# Patient Record
Sex: Male | Born: 1950
Health system: Southern US, Community
[De-identification: ages and names within clinical notes are randomized; demographics above are authoritative.]

## PROBLEM LIST (undated history)

## (undated) DIAGNOSIS — K219 Gastro-esophageal reflux disease without esophagitis: Secondary | ICD-10-CM

## (undated) DIAGNOSIS — K921 Melena: Secondary | ICD-10-CM

## (undated) HISTORY — DX: Melena: K92.1

## (undated) HISTORY — DX: Gastro-esophageal reflux disease without esophagitis: K21.9

---

## 2008-05-25 ENCOUNTER — Ambulatory Visit (HOSPITAL_COMMUNITY): Admission: RE | Admit: 2008-05-25 | Discharge: 2008-05-25 | Payer: Self-pay | Admitting: Gastroenterology

## 2008-05-25 ENCOUNTER — Encounter (INDEPENDENT_AMBULATORY_CARE_PROVIDER_SITE_OTHER): Payer: Self-pay | Admitting: Gastroenterology

## 2009-02-11 ENCOUNTER — Ambulatory Visit: Payer: Self-pay | Admitting: Internal Medicine

## 2009-02-11 ENCOUNTER — Observation Stay (HOSPITAL_COMMUNITY): Admission: EM | Admit: 2009-02-11 | Discharge: 2009-02-12 | Payer: Self-pay | Admitting: Emergency Medicine

## 2009-02-12 ENCOUNTER — Encounter (INDEPENDENT_AMBULATORY_CARE_PROVIDER_SITE_OTHER): Payer: Self-pay | Admitting: Gastroenterology

## 2010-10-20 LAB — HEMOGLOBIN AND HEMATOCRIT, BLOOD
HCT: 29.5 % — ABNORMAL LOW (ref 39.0–52.0)
Hemoglobin: 10.1 g/dL — ABNORMAL LOW (ref 13.0–17.0)

## 2010-10-21 LAB — CROSSMATCH

## 2010-10-21 LAB — COMPREHENSIVE METABOLIC PANEL
AST: 21 U/L (ref 0–37)
Albumin: 3.5 g/dL (ref 3.5–5.2)
Chloride: 106 mEq/L (ref 96–112)
Creatinine, Ser: 0.98 mg/dL (ref 0.4–1.5)
GFR calc Af Amer: >60 mL/min (ref >60)
Potassium: 3.9 mEq/L (ref 3.5–5.1)
Total Bilirubin: 0.6 mg/dL (ref 0.3–1.2)

## 2010-10-21 LAB — HEMOGLOBIN AND HEMATOCRIT, BLOOD
HCT: 30.6 % — ABNORMAL LOW (ref 39.0–52.0)
Hemoglobin: 10.4 g/dL — ABNORMAL LOW (ref 13.0–17.0)

## 2010-10-21 LAB — DIFFERENTIAL
Basophils Absolute: 0 10*3/uL (ref 0.0–0.1)
Eosinophils Relative: 1 % (ref 0–5)
Lymphocytes Relative: 32 % (ref 12–46)
Monocytes Absolute: 0.3 10*3/uL (ref 0.1–1.0)

## 2010-10-21 LAB — CBC
MCV: 87.6 fL (ref 78.0–100.0)
Platelets: 142 10*3/uL — ABNORMAL LOW (ref 150–400)
WBC: 4.7 10*3/uL (ref 4.0–10.5)

## 2010-10-21 LAB — HEMOCCULT GUIAC POC 1CARD (OFFICE): Fecal Occult Bld: POSITIVE

## 2010-10-21 LAB — PROTIME-INR: INR: 1 (ref 0.00–1.49)

## 2010-10-21 LAB — APTT: aPTT: 27 seconds (ref 24–37)

## 2010-10-21 LAB — ABO/RH: ABO/RH(D): O POS

## 2010-11-27 NOTE — Op Note (Signed)
Jerome Scott, Jerome Scott               ACCOUNT NO.:  [id number]   MEDICAL RECORD NO.:  [id number]          PATIENT TYPE:  AMB   LOCATION:  ENDO                         FACILITY:  [facility]   PHYSICIAN:  John C. Madilyn Fireman, M.D.    DATE OF BIRTH:  [birth year]   DATE OF PROCEDURE:  05/25/2008  DATE OF DISCHARGE:                               OPERATIVE REPORT   INDICATIONS FOR PROCEDURE:  Average risk colon cancer screening in 60-  year-old patient.   PROCEDURE:  The patient was placed in the left lateral decubitus  position and placed on the pulse monitor with continuous low-flow oxygen  delivered by nasal cannula.  He was sedated with 1 mg IV Versed in  addition to the medicine received for the previous EGD.  The Olympus  video colonoscope was inserted into the rectum and advanced to the  cecum, confirmed by transillumination of McBurney point and  visualization of the ileocecal valve and appendiceal orifice.  The prep  was good.  The cecum appeared normal with no masses, polyps,  diverticula, or other mucosal abnormalities.  Within the ascending  colon, there was seen an 8-mm sessile polyp, which was removed by snare.  The remainder of the ascending, transverse, and descending colon all  appeared normal with no masses, polyps, diverticula, or other mucosal  abnormalities.  Within the sigmoid colon, there was seen a few scattered  diverticula.  The rectum appeared normal and the retroflexed view of the  anus revealed no obvious internal hemorrhoids.  The scope was then  withdrawn and the patient returned to the recovery room in stable  condition.  He tolerated the procedure well.  There were no immediate  complications.   IMPRESSION:  1. Ascending colon polyp.  2. Sigmoid diverticulosis.   PLAN:  We will await biopsy results.           ______________________________  Everardo All Madilyn Fireman, M.D.     JCH/MEDQ  D:  05/25/2008  T:  05/25/2008  Job:  [id number]   cc:   Sigmund Hazel, M.D.

## 2010-11-27 NOTE — Consult Note (Signed)
NAMEELISA, Scott               ACCOUNT NO.:  [id number]   MEDICAL RECORD NO.:  [id number]          PATIENT TYPE:  INP   LOCATION:  0103                         FACILITY:  [facility]   PHYSICIAN:  James L. Malon Kindle., M.D.DATE OF BIRTH:  1951/05/17   DATE OF CONSULTATION:  02/11/2009  DATE OF DISCHARGE:                                 CONSULTATION   GASTROENTEROLOGY CONSULTATION   REASON FOR CONSULTATION:  Painless melena.   HISTORY:  The patient is a 60 year old male, who has seen by my partner,  Dr. Dorena Cookey.  In November of 2009, he had upper and lower endoscopies  by Dr. Madilyn Fireman due to some dysphagia, had some esophagitis, has been on  Protonix, which she takes intermittently.  For 3 days, he has had  painless melena.  He notes that has not been on the Protonix now for the  past 2 to 3 weeks.  He denies taking any nonsteroidal drugs, and I named  a number of over-the-counter medications and he denies them all.  He  called and gave the history that he was passing melenic stools and was  told come to the emergency room.  In the emergency room, his hemoglobin  was low at 11, and a rectal exam revealed a dark, tarry, melenic stool  that was grossly positive for blood.   MEDICATIONS ON ADMISSION:  None really.  He is supposed to take Protonix  but has not taken it in 2 or 3 weeks.   He has no drug allergies.   PAST MEDICAL HISTORY:  He has had esophagitis; no other chronic medical  problems.   PAST SURGICAL HISTORY:  No abdominal surgeries.  He has had previous  endoscopy and colonoscopy as noted.   SOCIAL HISTORY:  He does not use alcohol or tobacco. works for [employer].   PHYSICAL EXAMINATION:  VITAL SIGNS:  Normal.  He is afebrile.  EYES:  Sclerae are nonicteric.  NECK:  Supple.  LUNGS:  Clear.  HEART:  Regular rate and rhythm without murmurs or gallops.  ABDOMEN:  Mild obesity, but no guarding, rigidity, or rebound.   ASSESSMENT:  Painless melena - this could be due to a  number of things.  I suspect it is upper gastrointestinal, although he is not really having  any symptoms.  This is suggested by the fact his BUN is 24 and  creatinine 0.8.  I think he definitely needs an upper endoscopy and he  is agreeable.   PLAN:  Patient will be scheduled for an upper endoscopy in the morning  at 8 o'clock.  He has had the procedure done and hopefully we will be  able to discharge him if no acute bleeding is seen.  In the interim, I  would go ahead and give him aggressive PPI therapy.           ______________________________  Llana Aliment. Malon Kindle., M.D.     Waldron Session  D:  02/11/2009  T:  02/11/2009  Job:  [id number]   cc:   Everardo All. Madilyn Fireman, M.D.  Fax: [phone]

## 2010-11-27 NOTE — Op Note (Signed)
NAMETOU, HAYNER NO.:  [id number]   MEDICAL RECORD NO.:  [id number]          PATIENT TYPE:  INP   LOCATION:  1532                         FACILITY:  [facility]   PHYSICIAN:  James L. Malon Kindle., M.D.DATE OF BIRTH:  July 05, 1951   DATE OF PROCEDURE:  02/12/2009  DATE OF DISCHARGE:                               OPERATIVE REPORT   PROCEDURE:  Esophagogastroduodenoscopy and biopsy.   MEDICATIONS:  Cetacaine spray, fentanyl 50 mcg, Versed 5 mg IV.   INDICATIONS:  GI bleed.   DESCRIPTION OF PROCEDURE:  The procedure had been explained to the  patient and consent obtained.  In the left lateral decubitus position  the Pentax upper scope was inserted and advanced.  The scope was passed  down in the esophagus.  The patient had a large hiatal hernia extending  from 42-36 cm.  The diaphragm was seen at approximately 36 cm.  The Z-  line was somewhat irregular and there was a large ulceration with a  small visible vessel and possible Barrett's esophagus right at the  junction of the esophagus and hiatal hernia.  We passed down in the  stomach and a complete endoscopy performed.  No ulcerations or  inflammation.  We withdrew back in the stomach.  Pyloric channel and  antrum were normal.  In the retroflexed view the patient was seen to  have two gastric polyps that were biopsied.  The scope was withdrawn and  the initial findings in the esophagus confirmed.  There was no active  bleeding.   ASSESSMENT:  1. Large esophageal ulcer with possible visible vessel.  2. Possible Barrett's esophagus.  3. Gastric polyps biopsied.   PLAN:  We will treat the patient with high-dose of proton pump  inhibitor.  He will need a repeat procedure in 6 weeks.           ______________________________  Llana Aliment Malon Kindle., M.D.     Waldron Session  D:  02/12/2009  T:  02/12/2009  Job:  [id number]

## 2010-11-27 NOTE — Op Note (Signed)
Jerome Scott, Jerome Scott               ACCOUNT NO.:  [id number]   MEDICAL RECORD NO.:  [id number]          PATIENT TYPE:  AMB   LOCATION:  ENDO                         FACILITY:  [facility]   PHYSICIAN:  John C. Madilyn Fireman, M.D.    DATE OF BIRTH:  14-Feb-1951   DATE OF PROCEDURE:  05/25/2008  DATE OF DISCHARGE:                               OPERATIVE REPORT   INDICATIONS FOR PROCEDURE:  Intermittent solid food dysphagia.   PROCEDURE:  The patient was placed in the left lateral decubitus  position and placed on the pulse monitor with continuous low-flow oxygen  delivered by nasal cannula.  He was sedated with 75 mcg of IV fentanyl  and 5 mg of IV Versed.  The Olympus video endoscope was advanced under  direct vision into the oropharynx and esophagus.  The esophagus was  straight of normal caliber with the squamocolumnar line, irregular at 36  cm.  There were 3-4 saw-tooth shaped erosions, one with exudate and  probably a shallow ulcer extending about 2 cm up from the GE junction.  There was intense erythema surrounding the area of exudate associated  with the shallow ulcer, but no visible suspicion of neoplasm, otherwise  there was possibly some light fibrotic narrowing at that level.  There  was a 2-3 cm hiatal hernia beyond the GE junction.  The stomach was  entered and small amount of liquid secretions were suctioned from the  fundus.  Retroflexed view of the cardia confirmed a hiatal hernia and  was otherwise unremarkable.  The fundus, body, antrum, and pylorus all  appeared normal.  The duodenum was entered in both the bulb and second  portion are well inspected and appeared to be within normal limits.  The  scope was then withdrawn and the patient prepared for colonoscopy.  He  tolerated the procedure well.  There were no immediate complications.   IMPRESSION:  Significant erosive esophagitis with esophageal ulcer,  possible minimal associated stricture.  No dilatation done.   PLAN:   Double-dose proton pump inhibitor and follow up in the office and  consider repeat follow up EGD to assess for Barrett's or to perform  dilatation if necessary.           ______________________________  Everardo All Madilyn Fireman, M.D.     JCH/MEDQ  D:  05/25/2008  T:  05/25/2008  Job:  [id number]   cc:   Sigmund Hazel, M.D.

## 2010-11-27 NOTE — H&P (Signed)
Jerome Scott, Jerome Scott NO.:  [id number]   MEDICAL RECORD NO.:  [id number]          PATIENT TYPE:  EMS   LOCATION:  ED                           FACILITY:  [facility]   PHYSICIAN:  Sanda Linger, MD       DATE OF BIRTH:  [birth year]   DATE OF ADMISSION:  02/11/2009  DATE OF DISCHARGE:                              HISTORY & PHYSICAL   CHIEF COMPLAINT:  Melena.   HISTORY OF PRESENT ILLNESS:  This is a 60 year old male who felt well  until 3 days ago when he started noticing nausea and he says his stool  turned black and cakey.  He reports in November of 2009 he had upper and  lower endoscopies done by Dr. Madilyn Fireman.  He says there was some  inflammation in his esophagus but otherwise those tests were normal.  He  was placed on Protonix which he has consistently taken.  He does not  take any over-the-counter medicines such as aspirin, antiinflammatories  or any other OTC meds.  He does not drink alcohol or smoke cigarettes.  There is no family history of intestinal bleeding.  He has not had any  weight loss, loss of appetite, dysuria, hematuria and so there has been  no bright red blood in his bowel movements.  He has not had any  dizziness, shortness of breath, weakness, lethargy.  He is not having  bleeding or bruising elsewhere.   PAST MEDICAL HISTORY:  Positive for esophagitis.   SOCIAL HISTORY:  He works for [employer].  He is with his wife today.  Denies  abuse tobacco, alcohol and illicit drugs.   SURGICAL HISTORY:  Unremarkable.   CURRENT MEDICATIONS:  Protonix 40 mg once a day.   ALLERGIES:  He has no known drug allergies.   REVIEW OF SYSTEMS:  Negative.   PHYSICAL EXAMINATION:  Shows an alert, healthy, pleasant male.  His  temperature is 98.5.  His blood pressure is 133/87.  His pulse is 84 and  regular, respiratory rate is 18 and unlabored, pulse ox 97% and normal.  HEENT:  Shows pink moist mucous membranes with no icterus.  Nasopharynx,  oropharynx, posterior  pharynx show no lesions or exudate.  NECK:  Supple.  LUNGS:  Clear.  Cardiovascular:  Regular rhythm.  ABDOMEN:  Mildly obese but there is no tenderness on palpation.  No  hepatosplenomegaly, no masses, rebound or guarding.  SKIN:  Warm and moist with no petechiae, angioma or ecchymoses.  EXTREMITIES:  Show no cyanosis, clubbing or edema.  NEUROLOGIC:  Nonfocal.  RECTAL:  Digital rectal examination reveals grossly positive for blood,  melenic  stool.   LABORATORY DATA:  White cell count of 4.7, hemoglobin/hematocrit are  slightly low at 11.0 and 33.0.  Platelet count is normal at 142.  His  liver panel is normal.  His sodium is slightly low at 135, potassium  normal at 3.9, chloride and carbon dioxide are normal.  Glucose is  slightly elevated at 110.  BUN is slightly elevated at 24, creatinine is  normal at 0.98.  His calcium is normal at 8.5.  His total protein is low  at 5.6.   ASSESSMENT:  GI bleeding.  This looks like it is an upper GI bleed  although a lower intestinal bleeding such as diverticular bleed cannot  be completely excluded.   PLAN:  1. He will be admitted to the hospital, held n.p.o., given IV fluids      and IV proton pump inhibitor.  2. I have spoken to Dr. Randa Evens who is on call for Dr. Madilyn Fireman and they      will plan on doing an upper endoscopy on him in the next day or      two.  3. We will recheck his hemoglobin and hematocrit later tonight.  If he      is dropping precipitously will transfuse.  4. We will prevent DVT with venous compression devices.      Sanda Linger, MD  Electronically Signed     TJ/MEDQ  D:  02/11/2009  T:  02/11/2009  Job:  [id number]

## 2013-10-13 LAB — HM COLONOSCOPY: HM COLON: NORMAL

## 2014-01-31 ENCOUNTER — Other Ambulatory Visit: Payer: Self-pay | Admitting: Family Medicine

## 2014-01-31 ENCOUNTER — Encounter: Payer: Self-pay | Admitting: General Practice

## 2014-01-31 ENCOUNTER — Encounter: Payer: Self-pay | Admitting: Family Medicine

## 2014-01-31 ENCOUNTER — Ambulatory Visit (INDEPENDENT_AMBULATORY_CARE_PROVIDER_SITE_OTHER): Payer: BC Managed Care – PPO | Admitting: Family Medicine

## 2014-01-31 VITALS — BP 120/84 | HR 63 | Temp 98.0°F | Resp 16 | Ht 72.05 in | Wt 250.5 lb

## 2014-01-31 DIAGNOSIS — Z0000 Encounter for general adult medical examination without abnormal findings: Secondary | ICD-10-CM | POA: Insufficient documentation

## 2014-01-31 DIAGNOSIS — E785 Hyperlipidemia, unspecified: Secondary | ICD-10-CM

## 2014-01-31 LAB — HEPATIC FUNCTION PANEL
ALBUMIN: 3.9 g/dL (ref 3.5–5.2)
ALT: 36 U/L (ref 0–53)
AST: 32 U/L (ref 0–37)
Alkaline Phosphatase: 38 U/L — ABNORMAL LOW (ref 39–117)
BILIRUBIN DIRECT: 0.2 mg/dL (ref 0.0–0.3)
TOTAL PROTEIN: 6.7 g/dL (ref 6.0–8.3)
Total Bilirubin: 1.2 mg/dL (ref 0.2–1.2)

## 2014-01-31 LAB — BASIC METABOLIC PANEL
BUN: 13 mg/dL (ref 6–23)
CALCIUM: 9.3 mg/dL (ref 8.4–10.5)
CO2: 26 meq/L (ref 19–32)
CREATININE: 1.2 mg/dL (ref 0.4–1.5)
Chloride: 104 mEq/L (ref 96–112)
GFR: 67.43 mL/min (ref >60.00)
GLUCOSE: 81 mg/dL (ref 70–99)
Potassium: 4.1 mEq/L (ref 3.5–5.1)
SODIUM: 136 meq/L (ref 135–145)

## 2014-01-31 LAB — CBC WITH DIFFERENTIAL/PLATELET
BASOS ABS: 0 10*3/uL (ref 0.0–0.1)
BASOS PCT: 0.6 % (ref 0.0–3.0)
EOS ABS: 0.1 10*3/uL (ref 0.0–0.7)
Eosinophils Relative: 1.1 % (ref 0.0–5.0)
HCT: 40.6 % (ref 39.0–52.0)
Hemoglobin: 13.4 g/dL (ref 13.0–17.0)
LYMPHS PCT: 29.5 % (ref 12.0–46.0)
Lymphs Abs: 1.4 10*3/uL (ref 0.7–4.0)
MCHC: 32.9 g/dL (ref 30.0–36.0)
MCV: 80.9 fl (ref 78.0–100.0)
MONOS PCT: 6.5 % (ref 3.0–12.0)
Monocytes Absolute: 0.3 10*3/uL (ref 0.1–1.0)
NEUTROS PCT: 62.3 % (ref 43.0–77.0)
Neutro Abs: 3.1 10*3/uL (ref 1.4–7.7)
Platelets: 156 10*3/uL (ref 150.0–400.0)
RBC: 5.02 Mil/uL (ref 4.22–5.81)
RDW: 14.9 % (ref 11.5–15.5)
WBC: 4.9 10*3/uL (ref 4.0–10.5)

## 2014-01-31 LAB — LIPID PANEL
CHOLESTEROL: 202 mg/dL — AB (ref 0–200)
HDL: 34.1 mg/dL — AB (ref >39.00)
LDL CALC: 125 mg/dL — AB (ref 0–99)
NonHDL: 167.9
TRIGLYCERIDES: 213 mg/dL — AB (ref 0.0–149.0)
Total CHOL/HDL Ratio: 6
VLDL: 42.6 mg/dL — AB (ref 0.0–40.0)

## 2014-01-31 LAB — TSH: TSH: 2.8 u[IU]/mL (ref 0.35–4.50)

## 2014-01-31 LAB — PSA: PSA: 2.59 ng/mL (ref 0.10–4.00)

## 2014-01-31 NOTE — Progress Notes (Signed)
Pre visit review using our clinic review tool, if applicable. No additional management support is needed unless otherwise documented below in the visit note. 

## 2014-01-31 NOTE — Patient Instructions (Signed)
Follow up in 1 year or as needed We'll notify you of your lab results and make any changes if needed Try and make healthy food choices and get regular exercise Call with any questions or concerns Enjoy the rest of your summer! Welcome!  We're glad to have you!

## 2014-01-31 NOTE — Progress Notes (Signed)
   Subjective:    Patient ID: Jerome Scott, male    DOB: Dec 30, 1950, 63 y.o.   MRN: [id number]  HPI New to establish.  Previous PCP- [facility] but did not go w/ regularity  UTD on colonoscopy- [facility]  CPE- no concerns today, UTD on colonoscopy.   Review of Systems Patient reports no vision/hearing changes, anorexia, fever ,adenopathy, persistant/recurrent hoarseness, swallowing issues, chest pain, palpitations, edema, persistant/recurrent cough, hemoptysis, dyspnea (rest,exertional, paroxysmal nocturnal), gastrointestinal  bleeding (melena, rectal bleeding), abdominal pain, excessive heart burn, GU symptoms (dysuria, hematuria, voiding/incontinence issues) syncope, focal weakness, memory loss, numbness & tingling, skin/hair/nail changes, depression, anxiety, abnormal bruising/bleeding, musculoskeletal symptoms/signs.     Objective:   Physical Exam BP 120/84  Pulse 63  Temp(Src) 98 F (36.7 C) (Oral)  Resp 16  Ht 5\' 5"  (1.651 m)  Wt 250 lb 8 oz (113.626 kg)  BMI 41.69 kg/m2  SpO2 96%  General Appearance:    Alert, cooperative, no distress, appears stated age  Head:    Normocephalic, without obvious abnormality, atraumatic  Eyes:    PERRL, conjunctiva/corneas clear, EOM's intact, fundi    benign, both eyes       Ears:    Normal TM's and external ear canals, both ears  Nose:   Nares normal, septum midline, mucosa normal, no drainage   or sinus tenderness  Throat:   Lips, mucosa, and tongue normal; teeth and gums normal  Neck:   Supple, symmetrical, trachea midline, no adenopathy;       thyroid:  No enlargement/tenderness/nodules  Back:     Symmetric, no curvature, ROM normal, no CVA tenderness  Lungs:     Clear to auscultation bilaterally, respirations unlabored  Chest wall:    No tenderness or deformity  Heart:    Regular rate and rhythm, S1 and S2 normal, no murmur, rub   or gallop  Abdomen:     Soft, non-tender, bowel sounds active all four quadrants,    no  masses, no organomegaly.  + umbilical hernia  Genitalia:    Normal male without lesion, discharge or tenderness  Rectal:    Normal tone, normal prostate, no masses or tenderness  Extremities:   Extremities normal, atraumatic, no cyanosis or edema  Pulses:   2+ and symmetric all extremities  Skin:   Skin color, texture, turgor normal, no rashes or lesions  Lymph nodes:   Cervical, supraclavicular, and axillary nodes normal  Neurologic:   CNII-XII intact. Normal strength, sensation and reflexes      throughout          Assessment & Plan:

## 2014-02-01 NOTE — Assessment & Plan Note (Signed)
New.  Pt's PE WNL w/ exception of weight.  Encouraged him to make healthy food choices and get regular exercise.  EKG done to establish baseline.  Check labs.  Anticipatory guidance provided.

## 2017-07-04 ENCOUNTER — Ambulatory Visit: Payer: Self-pay | Admitting: Family Medicine

## 2017-07-11 ENCOUNTER — Ambulatory Visit: Payer: Self-pay | Admitting: Family Medicine

## 2017-07-21 ENCOUNTER — Ambulatory Visit (INDEPENDENT_AMBULATORY_CARE_PROVIDER_SITE_OTHER): Payer: BLUE CROSS/BLUE SHIELD | Admitting: Family Medicine

## 2017-07-21 ENCOUNTER — Encounter: Payer: Self-pay | Admitting: Family Medicine

## 2017-07-21 ENCOUNTER — Other Ambulatory Visit: Payer: Self-pay

## 2017-07-21 VITALS — BP 134/86 | HR 62 | Temp 98.1°F | Resp 16 | Ht 72.0 in | Wt 255.1 lb

## 2017-07-21 DIAGNOSIS — Z125 Encounter for screening for malignant neoplasm of prostate: Secondary | ICD-10-CM | POA: Diagnosis not present

## 2017-07-21 DIAGNOSIS — M7989 Other specified soft tissue disorders: Secondary | ICD-10-CM

## 2017-07-21 DIAGNOSIS — E669 Obesity, unspecified: Secondary | ICD-10-CM | POA: Diagnosis not present

## 2017-07-21 DIAGNOSIS — Z0000 Encounter for general adult medical examination without abnormal findings: Secondary | ICD-10-CM | POA: Diagnosis not present

## 2017-07-21 DIAGNOSIS — M799 Soft tissue disorder, unspecified: Secondary | ICD-10-CM

## 2017-07-21 LAB — CBC WITH DIFFERENTIAL/PLATELET
Basophils Absolute: 0.1 10*3/uL (ref 0.0–0.1)
Basophils Relative: 1.3 % (ref 0.0–3.0)
EOS ABS: 0.1 10*3/uL (ref 0.0–0.7)
EOS PCT: 1.1 % (ref 0.0–5.0)
HCT: 43.7 % (ref 39.0–52.0)
HEMOGLOBIN: 14.3 g/dL (ref 13.0–17.0)
LYMPHS PCT: 33.8 % (ref 12.0–46.0)
Lymphs Abs: 1.6 10*3/uL (ref 0.7–4.0)
MCHC: 32.7 g/dL (ref 30.0–36.0)
MCV: 82 fl (ref 78.0–100.0)
MONO ABS: 0.4 10*3/uL (ref 0.1–1.0)
Monocytes Relative: 8 % (ref 3.0–12.0)
Neutro Abs: 2.6 10*3/uL (ref 1.4–7.7)
Neutrophils Relative %: 55.8 % (ref 43.0–77.0)
Platelets: 136 10*3/uL — ABNORMAL LOW (ref 150.0–400.0)
RBC: 5.33 Mil/uL (ref 4.22–5.81)
RDW: 14.7 % (ref 11.5–15.5)
WBC: 4.7 10*3/uL (ref 4.0–10.5)

## 2017-07-21 LAB — LIPID PANEL
CHOL/HDL RATIO: 7
CHOLESTEROL: 216 mg/dL — AB (ref 0–200)
HDL: 30.9 mg/dL — AB (ref >39.00)
NONHDL: 185.44
TRIGLYCERIDES: 231 mg/dL — AB (ref 0.0–149.0)
VLDL: 46.2 mg/dL — ABNORMAL HIGH (ref 0.0–40.0)

## 2017-07-21 LAB — PSA: PSA: 2.46 ng/mL (ref 0.10–4.00)

## 2017-07-21 LAB — BASIC METABOLIC PANEL
BUN: 11 mg/dL (ref 6–23)
CALCIUM: 9.2 mg/dL (ref 8.4–10.5)
CHLORIDE: 102 meq/L (ref 96–112)
CO2: 28 mEq/L (ref 19–32)
CREATININE: 1.13 mg/dL (ref 0.40–1.50)
GFR: 69.4 mL/min (ref >60.00)
Glucose, Bld: 93 mg/dL (ref 70–99)
Potassium: 4.2 mEq/L (ref 3.5–5.1)
Sodium: 136 mEq/L (ref 135–145)

## 2017-07-21 LAB — HEPATIC FUNCTION PANEL
ALT: 31 U/L (ref 0–53)
AST: 24 U/L (ref 0–37)
Albumin: 4.1 g/dL (ref 3.5–5.2)
Alkaline Phosphatase: 47 U/L (ref 39–117)
Bilirubin, Direct: 0.1 mg/dL (ref 0.0–0.3)
Total Bilirubin: 1 mg/dL (ref 0.2–1.2)
Total Protein: 6.5 g/dL (ref 6.0–8.3)

## 2017-07-21 LAB — TSH: TSH: 3.46 u[IU]/mL (ref 0.35–4.50)

## 2017-07-21 LAB — LDL CHOLESTEROL, DIRECT: LDL DIRECT: 141 mg/dL

## 2017-07-21 NOTE — Assessment & Plan Note (Signed)
Ongoing issue for pt.  Stressed need for healthy diet and regular exercise.  Check labs to risk stratify.  Will follow 

## 2017-07-21 NOTE — Progress Notes (Signed)
   Subjective:    Patient ID: Jerome Scott, male    DOB: 08-21-1950, 67 y.o.   MRN: [id number]  HPI Pt to re-establish care.  No concerns today.  UTD on colonoscopy, UTD on Tdap.  Declines flu shot.  CPE- no concerns today.   Review of Systems Patient reports no vision/hearing changes, anorexia, fever ,adenopathy, persistant/recurrent hoarseness, swallowing issues, chest pain, palpitations, edema, persistant/recurrent cough, hemoptysis, dyspnea (rest,exertional, paroxysmal nocturnal), gastrointestinal  bleeding (melena, rectal bleeding), abdominal pain, excessive heart burn, GU symptoms (dysuria, hematuria, voiding/incontinence issues) syncope, focal weakness, memory loss, numbness & tingling, skin/hair/nail changes, depression, anxiety, abnormal bruising/bleeding, musculoskeletal symptoms/signs.     Objective:   Physical Exam BP 140/90   Pulse 62   Temp 98.1 F (36.7 C) (Oral)   Resp 16   Ht 6' (1.829 m)   Wt 255 lb 2 oz (115.7 kg)   SpO2 98%   BMI 34.60 kg/m   General Appearance:    Alert, cooperative, no distress, appears stated age  Head:    Normocephalic, without obvious abnormality, atraumatic  Eyes:    PERRL, conjunctiva/corneas clear, EOM's intact, fundi    benign, both eyes       Ears:    Normal TM's and external ear canals, both ears  Nose:   Nares normal, septum midline, mucosa normal, no drainage   or sinus tenderness  Throat:   Lips, mucosa, and tongue normal; teeth and gums normal  Neck:   Supple, symmetrical, trachea midline, no adenopathy;       thyroid:  No enlargement/tenderness/nodules L anterior soft tissue mass- no TTP, freely mobile  Back:     Symmetric, no curvature, ROM normal, no CVA tenderness  Lungs:     Clear to auscultation bilaterally, respirations unlabored  Chest wall:    No tenderness or deformity  Heart:    Regular rate and rhythm, S1 and S2 normal, no murmur, rub   or gallop  Abdomen:     Soft, non-tender, bowel sounds active all four  quadrants,    no masses, no organomegaly  Genitalia:    Normal male without lesion, discharge or tenderness  Rectal:    Normal tone, normal prostate, no masses or tenderness  Extremities:   Extremities normal, atraumatic, no cyanosis or edema  Pulses:   2+ and symmetric all extremities  Skin:   Skin color, texture, turgor normal, no rashes or lesions  Lymph nodes:   Cervical, supraclavicular, and axillary nodes normal  Neurologic:   CNII-XII intact. Normal strength, sensation and reflexes      throughout          Assessment & Plan:

## 2017-07-21 NOTE — Patient Instructions (Signed)
Follow up in 1 year or as needed We'll notify you of your lab results and make any changes if needed We'll call you with your ultrasound appt for the lump in your neck Try and make healthy food choices and get regular exercise- you can do it! Call with any questions or concerns Happy New Year!!

## 2017-07-21 NOTE — Assessment & Plan Note (Addendum)
Pt's PE WNL w/ exception of obesity and soft tissue mass of L anterior neck ([place] ordered).  UTD on colonoscopy, Tdap.  Declines flu.  Check labs.  Anticipatory guidance provided.

## 2017-07-22 ENCOUNTER — Ambulatory Visit (HOSPITAL_BASED_OUTPATIENT_CLINIC_OR_DEPARTMENT_OTHER)
Admission: RE | Admit: 2017-07-22 | Discharge: 2017-07-22 | Disposition: A | Discharge status: Home / Self Care | Payer: BLUE CROSS/BLUE SHIELD | Source: Ambulatory Visit | Attending: Family Medicine | Admitting: Family Medicine

## 2017-07-22 ENCOUNTER — Other Ambulatory Visit: Payer: Self-pay | Admitting: General Practice

## 2017-07-22 DIAGNOSIS — M799 Soft tissue disorder, unspecified: Secondary | ICD-10-CM | POA: Insufficient documentation

## 2017-07-22 DIAGNOSIS — E785 Hyperlipidemia, unspecified: Secondary | ICD-10-CM

## 2017-07-22 DIAGNOSIS — M7989 Other specified soft tissue disorders: Secondary | ICD-10-CM

## 2017-07-22 MED ORDER — ATORVASTATIN CALCIUM 10 MG PO TABS: 10.0000 mg | ORAL_TABLET | Freq: Every day | ORAL | 0 refills | Status: DC

## 2017-07-23 ENCOUNTER — Other Ambulatory Visit: Payer: Self-pay | Admitting: Family Medicine

## 2017-07-23 DIAGNOSIS — R221 Localized swelling, mass and lump, neck: Secondary | ICD-10-CM

## 2017-07-24 ENCOUNTER — Ambulatory Visit (HOSPITAL_BASED_OUTPATIENT_CLINIC_OR_DEPARTMENT_OTHER)
Admission: RE | Admit: 2017-07-24 | Discharge: 2017-07-24 | Disposition: A | Discharge status: Home / Self Care | Payer: BLUE CROSS/BLUE SHIELD | Source: Ambulatory Visit | Attending: Family Medicine | Admitting: Family Medicine

## 2017-07-24 DIAGNOSIS — R221 Localized swelling, mass and lump, neck: Secondary | ICD-10-CM | POA: Insufficient documentation

## 2017-07-24 MED ORDER — IOPAMIDOL (ISOVUE-300) INJECTION 61%
100.0000 mL | Freq: Once | INTRAVENOUS | Status: AC | PRN
  Administered 2017-07-24: 80 mL via INTRAVENOUS

## 2017-09-01 ENCOUNTER — Other Ambulatory Visit (INDEPENDENT_AMBULATORY_CARE_PROVIDER_SITE_OTHER): Payer: BLUE CROSS/BLUE SHIELD

## 2017-09-01 DIAGNOSIS — E785 Hyperlipidemia, unspecified: Secondary | ICD-10-CM | POA: Diagnosis not present

## 2017-09-01 LAB — HEPATIC FUNCTION PANEL
ALBUMIN: 4 g/dL (ref 3.5–5.2)
ALT: 25 U/L (ref 0–53)
AST: 22 U/L (ref 0–37)
Alkaline Phosphatase: 42 U/L (ref 39–117)
Bilirubin, Direct: 0.1 mg/dL (ref 0.0–0.3)
Total Bilirubin: 0.7 mg/dL (ref 0.2–1.2)
Total Protein: 6.6 g/dL (ref 6.0–8.3)

## 2017-09-02 ENCOUNTER — Encounter: Payer: Self-pay | Admitting: General Practice

## 2017-09-02 ENCOUNTER — Other Ambulatory Visit: Payer: BLUE CROSS/BLUE SHIELD

## 2017-10-20 ENCOUNTER — Other Ambulatory Visit: Payer: Self-pay | Admitting: Family Medicine

## 2018-01-17 ENCOUNTER — Other Ambulatory Visit: Payer: Self-pay | Admitting: Family Medicine

## 2018-01-20 ENCOUNTER — Encounter: Payer: Self-pay | Admitting: Family Medicine

## 2018-01-20 ENCOUNTER — Other Ambulatory Visit: Payer: Self-pay

## 2018-01-20 ENCOUNTER — Ambulatory Visit (INDEPENDENT_AMBULATORY_CARE_PROVIDER_SITE_OTHER): Payer: BLUE CROSS/BLUE SHIELD | Admitting: Family Medicine

## 2018-01-20 DIAGNOSIS — E785 Hyperlipidemia, unspecified: Secondary | ICD-10-CM | POA: Insufficient documentation

## 2018-01-20 LAB — LIPID PANEL
CHOLESTEROL: 175 mg/dL (ref 0–200)
HDL: 38 mg/dL — ABNORMAL LOW (ref >39.00)
LDL CALC: 117 mg/dL — AB (ref 0–99)
NonHDL: 136.7
TRIGLYCERIDES: 98 mg/dL (ref 0.0–149.0)
Total CHOL/HDL Ratio: 5
VLDL: 19.6 mg/dL (ref 0.0–40.0)

## 2018-01-20 LAB — BASIC METABOLIC PANEL
BUN: 20 mg/dL (ref 6–23)
CALCIUM: 9.1 mg/dL (ref 8.4–10.5)
CO2: 29 mEq/L (ref 19–32)
CREATININE: 1.01 mg/dL (ref 0.40–1.50)
Chloride: 104 mEq/L (ref 96–112)
GFR: 78.88 mL/min (ref >60.00)
GLUCOSE: 91 mg/dL (ref 70–99)
Potassium: 4.2 mEq/L (ref 3.5–5.1)
Sodium: 138 mEq/L (ref 135–145)

## 2018-01-20 LAB — HEPATIC FUNCTION PANEL
ALBUMIN: 4.3 g/dL (ref 3.5–5.2)
ALT: 13 U/L (ref 0–53)
AST: 13 U/L (ref 0–37)
Alkaline Phosphatase: 42 U/L (ref 39–117)
Bilirubin, Direct: 0.1 mg/dL (ref 0.0–0.3)
Total Bilirubin: 0.8 mg/dL (ref 0.2–1.2)
Total Protein: 6.7 g/dL (ref 6.0–8.3)

## 2018-01-20 NOTE — Progress Notes (Signed)
   Subjective:    Patient ID: Jerome Scott, male    DOB: 09-24-1950, 67 y.o.   MRN: [id number]  HPI Hyperlipidemia- chronic problem, on Lipitor 10mg  daily.  Pt has lost 40 lbs since January!  Pt has changed his diet- has almost completely cut out junk food.  No CP, SOB, HAs, abd pain, N/V.   Review of Systems For ROS see HPI     Objective:   Physical Exam  Constitutional: He is oriented to person, place, and time. He appears well-developed and well-nourished. No distress.  HENT:  Head: Normocephalic and atraumatic.  Eyes: Pupils are equal, round, and reactive to light. Conjunctivae and EOM are normal.  Neck: Normal range of motion. Neck supple. No thyromegaly present.  Cardiovascular: Normal rate, regular rhythm, normal heart sounds and intact distal pulses.  No murmur heard. Pulmonary/Chest: Effort normal and breath sounds normal. No respiratory distress.  Abdominal: Soft. Bowel sounds are normal. He exhibits no distension.  Musculoskeletal: He exhibits no edema.  Lymphadenopathy:    He has no cervical adenopathy.  Neurological: He is alert and oriented to person, place, and time. No cranial nerve deficit.  Skin: Skin is warm and dry.  Psychiatric: He has a normal mood and affect. His behavior is normal.  Vitals reviewed.         Assessment & Plan:

## 2018-01-20 NOTE — Patient Instructions (Signed)
Schedule your complete physical in 6 months We'll notify you of your lab results and make any changes if needed Keep up the good work on healthy diet and regular exercise- you look great! Call with any questions or concerns Have a great summer!!! 

## 2018-01-20 NOTE — Assessment & Plan Note (Signed)
Pt was started on Crestor at last visit.  He has lost 40 lbs since January.  Applauded his efforts.  Check labs.  Adjust meds prn

## 2018-06-22 ENCOUNTER — Other Ambulatory Visit: Payer: Self-pay | Admitting: Family Medicine

## 2018-07-19 IMAGING — CT CT NECK W/ CM
3 of 4 series · 13 of 33 positions shown, 16 images · IV contrast (iopamidol)
Comparison: 07/22/2016 neck soft tissue ultrasound

CLINICAL DATA: 66 y/o M; anterolateral left-sided neck mass for 15
years.

EXAM:
CT NECK WITH CONTRAST
TECHNIQUE: Multidetector CT imaging of the neck was performed using the
standard protocol following the bolus administration of intravenous
contrast.
CONTRAST:  80mL T8OJQR-2NN IOPAMIDOL (T8OJQR-2NN) INJECTION 61%

[Series 5: sag neck · sagittal · 0.56mm/px · 5 of 101 slices shown, 6 images]
[im 34/101  bone]
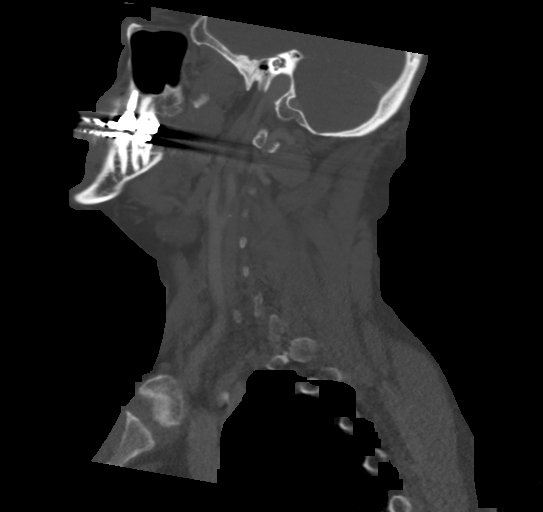
[im 42/101  bone]
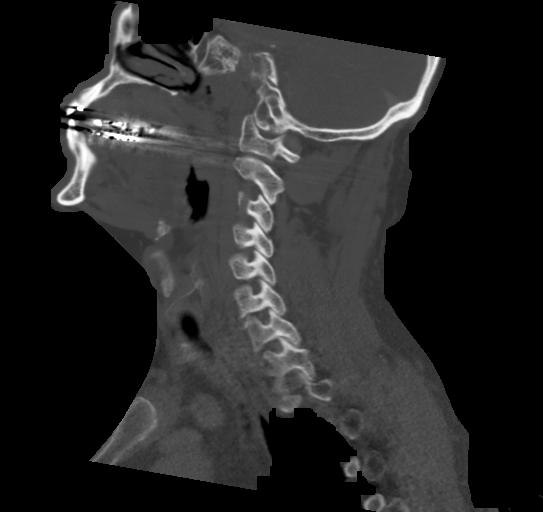
[im 51/101  soft-tissue]
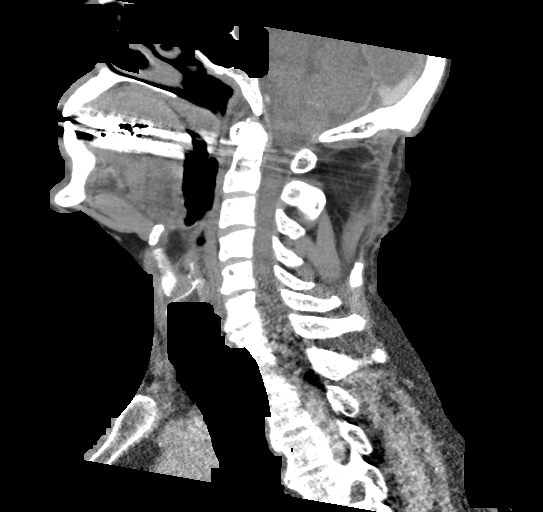
[im 51/101  bone]
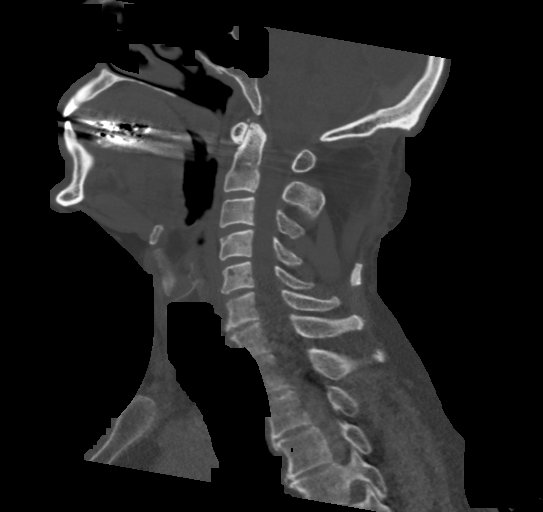
[im 59/101  bone]
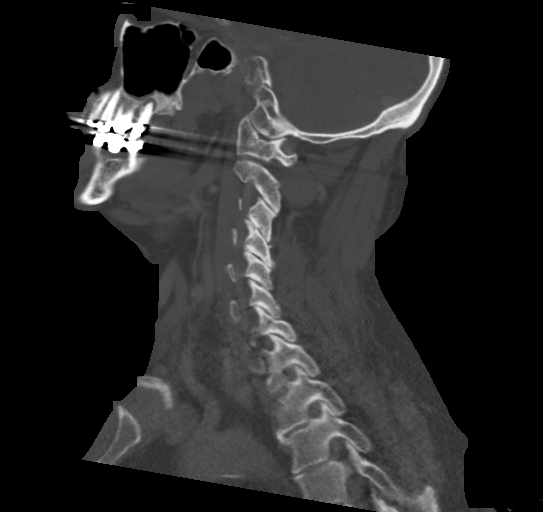
[im 67/101  bone]
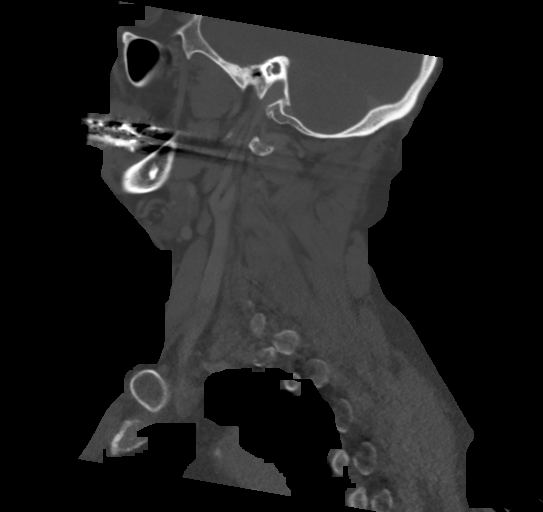

[Series 6: cor neck · coronal · 0.42mm/px · 3 of 153 slices shown]
[im 31/153  bone]
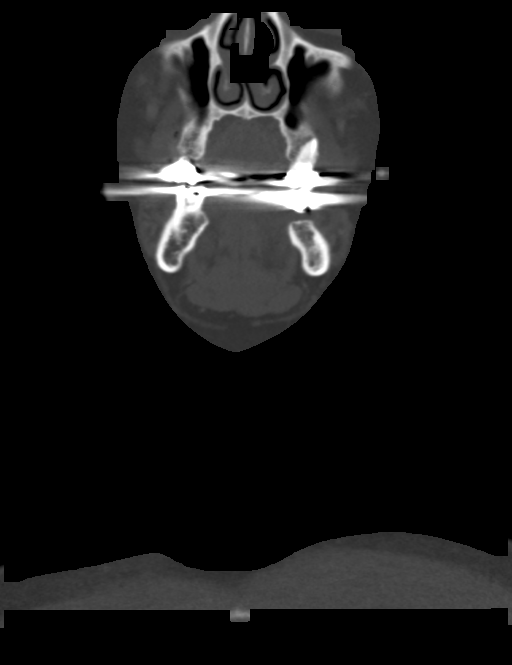
[im 61/153  bone]
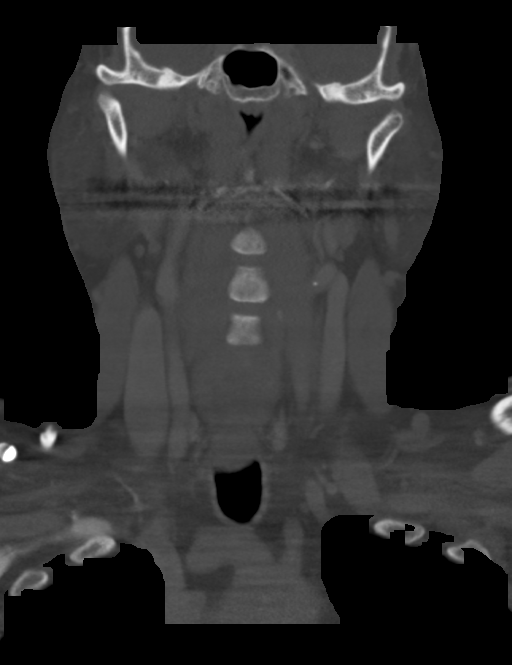
[im 92/153  bone]
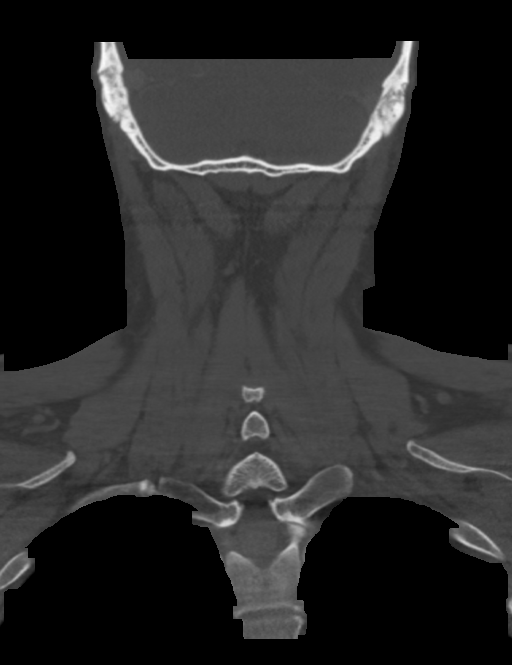

[Series 7: orthogonal ax · axial · 0.48mm/px · z∈[-295,-105]mm · 5 of 145 slices shown, 7 images]
[im 25/145  soft-tissue]
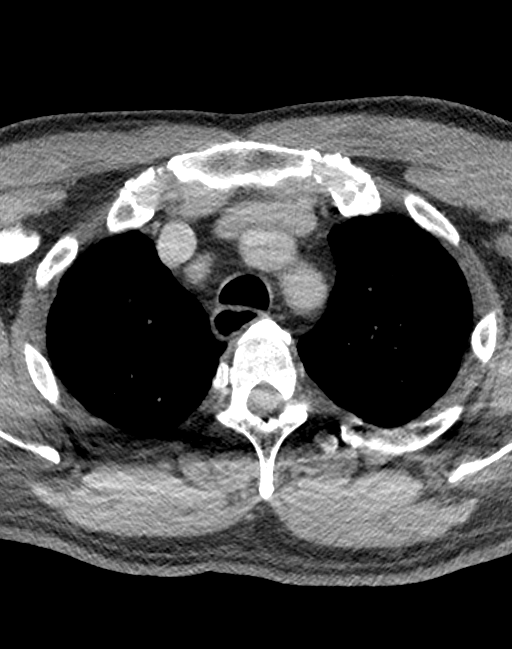
[im 25/145  bone]
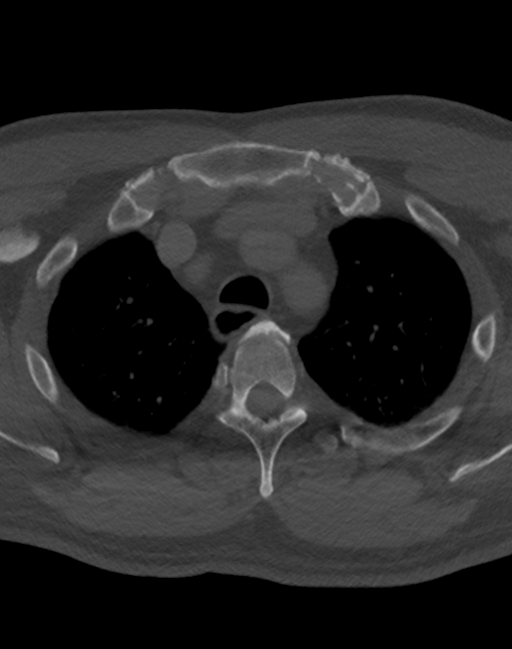
[im 49/145  bone]
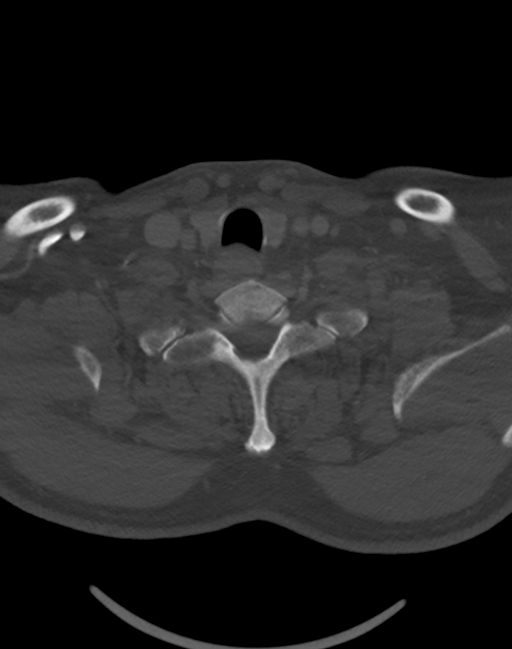
[im 73/145  bone]
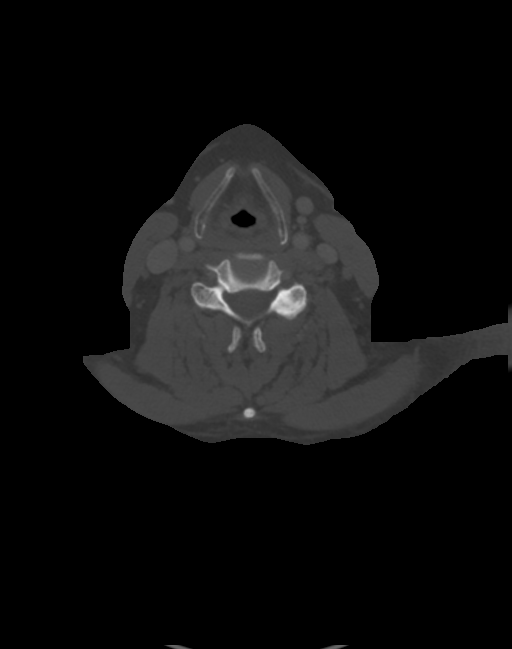
[im 97/145  bone]
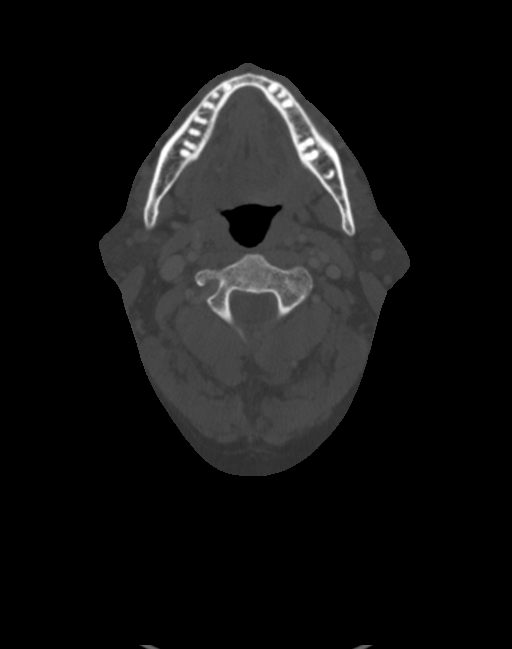
[im 121/145  soft-tissue]
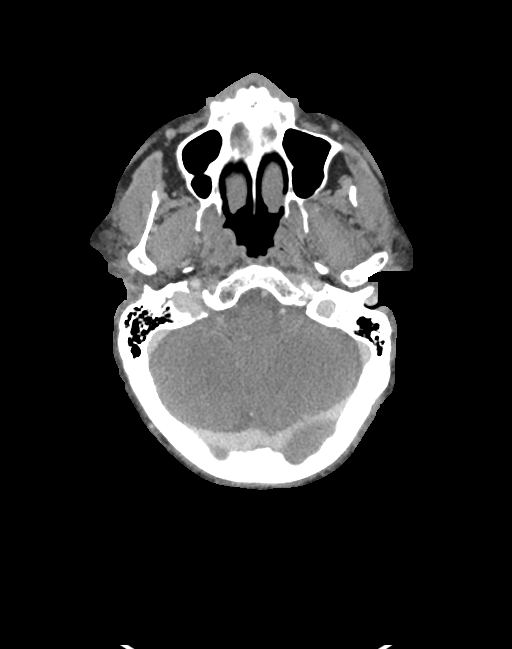
[im 121/145  bone]
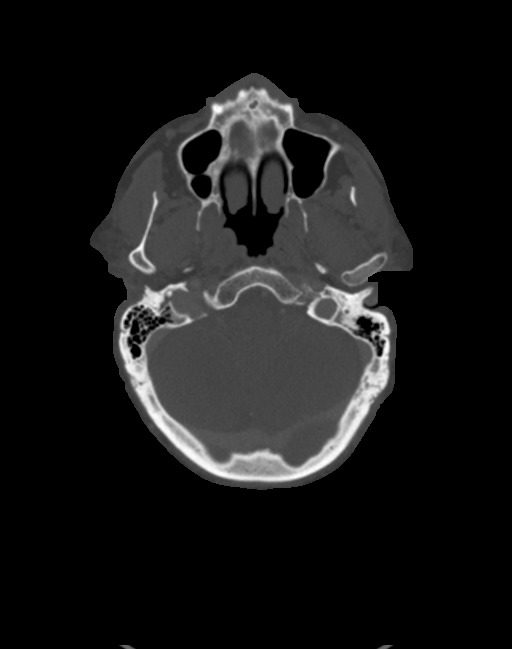

[13 of 33 positions shown; findings below may reference images not displayed]

FINDINGS: Pharynx and larynx: Normal. No mass or swelling.

Salivary glands: No inflammation, mass, or stone.

Thyroid: Normal.

Lymph nodes: None enlarged or abnormal density.

Vascular: Negative.

Limited intracranial: Negative.

Visualized orbits: Negative.

Mastoids and visualized paranasal sinuses: Clear.

Skeleton: No acute or aggressive process.

Upper chest: Negative.

Other: In the left anterior neck subcutaneous fat marked by capsule
as area of palpable interest, there is a poorly defined fat
attenuating lesion (-29 HU) measuring approximately 8 x 15 x 18 mm
(AP x ML x CC series 3, image 84 and series 5, image 66) likely
corresponding to the structure identified on prior ultrasound. There
is a linear calcification centrally and no soft tissue component.
IMPRESSION: Fat attenuating lesion in area of palpable interest centered in left
anterior neck subcutaneous fat is a linear calcification. No soft
tissue component or lymphadenopathy. Probable differential of
lipoma, fat necrosis, or granuloma.

By: Vilma Rau M.D.

## 2018-07-23 ENCOUNTER — Encounter: Payer: Self-pay | Admitting: Family Medicine

## 2018-07-23 ENCOUNTER — Ambulatory Visit (INDEPENDENT_AMBULATORY_CARE_PROVIDER_SITE_OTHER): Payer: Medicare HMO | Admitting: Family Medicine

## 2018-07-23 ENCOUNTER — Encounter: Payer: Self-pay | Admitting: General Practice

## 2018-07-23 ENCOUNTER — Other Ambulatory Visit: Payer: Self-pay

## 2018-07-23 VITALS — BP 121/74 | HR 56 | Temp 98.0°F | Resp 17 | Ht 72.0 in | Wt 219.4 lb

## 2018-07-23 DIAGNOSIS — Z23 Encounter for immunization: Secondary | ICD-10-CM | POA: Diagnosis not present

## 2018-07-23 DIAGNOSIS — E785 Hyperlipidemia, unspecified: Secondary | ICD-10-CM | POA: Diagnosis not present

## 2018-07-23 DIAGNOSIS — Z0000 Encounter for general adult medical examination without abnormal findings: Secondary | ICD-10-CM

## 2018-07-23 DIAGNOSIS — Z125 Encounter for screening for malignant neoplasm of prostate: Secondary | ICD-10-CM | POA: Diagnosis not present

## 2018-07-23 LAB — BASIC METABOLIC PANEL
BUN: 17 mg/dL (ref 6–23)
CALCIUM: 9.2 mg/dL (ref 8.4–10.5)
CHLORIDE: 104 meq/L (ref 96–112)
CO2: 27 mEq/L (ref 19–32)
CREATININE: 0.97 mg/dL (ref 0.40–1.50)
GFR: 82.51 mL/min (ref >60.00)
Glucose, Bld: 91 mg/dL (ref 70–99)
Potassium: 4.5 mEq/L (ref 3.5–5.1)
SODIUM: 137 meq/L (ref 135–145)

## 2018-07-23 LAB — CBC WITH DIFFERENTIAL/PLATELET
BASOS PCT: 1 % (ref 0.0–3.0)
Basophils Absolute: 0 10*3/uL (ref 0.0–0.1)
EOS PCT: 1 % (ref 0.0–5.0)
Eosinophils Absolute: 0 10*3/uL (ref 0.0–0.7)
HEMATOCRIT: 42.8 % (ref 39.0–52.0)
Hemoglobin: 14.2 g/dL (ref 13.0–17.0)
LYMPHS PCT: 31.1 % (ref 12.0–46.0)
Lymphs Abs: 1.1 10*3/uL (ref 0.7–4.0)
MCHC: 33.3 g/dL (ref 30.0–36.0)
MCV: 81.9 fl (ref 78.0–100.0)
Monocytes Absolute: 0.3 10*3/uL (ref 0.1–1.0)
Monocytes Relative: 8.1 % (ref 3.0–12.0)
Neutro Abs: 2.2 10*3/uL (ref 1.4–7.7)
Neutrophils Relative %: 58.8 % (ref 43.0–77.0)
Platelets: 114 10*3/uL — ABNORMAL LOW (ref 150.0–400.0)
RBC: 5.23 Mil/uL (ref 4.22–5.81)
RDW: 16.5 % — AB (ref 11.5–15.5)
WBC: 3.7 10*3/uL — ABNORMAL LOW (ref 4.0–10.5)

## 2018-07-23 LAB — HEPATIC FUNCTION PANEL
ALT: 10 U/L (ref 0–53)
AST: 13 U/L (ref 0–37)
Albumin: 4.2 g/dL (ref 3.5–5.2)
Alkaline Phosphatase: 36 U/L — ABNORMAL LOW (ref 39–117)
BILIRUBIN TOTAL: 0.9 mg/dL (ref 0.2–1.2)
Bilirubin, Direct: 0.2 mg/dL (ref 0.0–0.3)
Total Protein: 6.3 g/dL (ref 6.0–8.3)

## 2018-07-23 LAB — LIPID PANEL
CHOL/HDL RATIO: 5
Cholesterol: 196 mg/dL (ref 0–200)
HDL: 38.4 mg/dL — AB (ref >39.00)
LDL Cholesterol: 142 mg/dL — ABNORMAL HIGH (ref 0–99)
NONHDL: 157.14
Triglycerides: 77 mg/dL (ref 0.0–149.0)
VLDL: 15.4 mg/dL (ref 0.0–40.0)

## 2018-07-23 LAB — TSH: TSH: 3.26 u[IU]/mL (ref 0.35–4.50)

## 2018-07-23 LAB — PSA, MEDICARE: PSA: 1.88 ng/mL (ref 0.10–4.00)

## 2018-07-23 NOTE — Patient Instructions (Signed)
Follow up in 6 months to recheck cholesterol We'll notify you of your lab results and make any changes if needed Keep up the good work on healthy diet and regular exercise- you look great! Call with any questions or concerns Happy New Year! ENJOY YOUR TRIP!!!

## 2018-07-23 NOTE — Assessment & Plan Note (Signed)
Pt's PE WNL.  UTD on colonoscopy, Tdap.  Declines flu.  Prevnar given today.  Anticipatory guidance provided.

## 2018-07-23 NOTE — Addendum Note (Signed)
Addended by: Davis Gourd on: 07/23/2018 10:37 AM   Modules accepted: Orders

## 2018-07-23 NOTE — Assessment & Plan Note (Signed)
Ongoing issue for pt.  Tolerating statin w/o difficulty.  Check labs.  Adjust meds prn

## 2018-07-23 NOTE — Progress Notes (Addendum)
   Subjective:    Patient ID: Jerome Scott, male    DOB: February 06, 1951, 68 y.o.   MRN: [id number]  HPI Here today for CPE.  Risk Factors: Hyperlipidemia- chronic problem, on Lipitor daily [facility]: [facility] Fall Risk: low Depression: denies current sxs, screened using PHQ2/9- score 0 for both Hearing: normal to conversational tones and whispered voice at 6 ft ADL's: independent Cognitive: normal linear thought process, memory and attention intact Home Safety: safe at home, lives w/ wife Height, Weight, BMI, Visual Acuity: see vitals, vision corrected to 20/20 w/ glasses Counseling: UTD on colonoscopy, declines flu.  Due for [place] Will: Pt has this at home, will bring to office Labs Ordered: See A&P Care Plan: See A&P   Reviewed past medical, surgical, family and social histories.   Review of Systems Patient reports no vision/hearing changes, anorexia, fever ,adenopathy, persistant/recurrent hoarseness, swallowing issues, chest pain, palpitations, edema, persistant/recurrent cough, hemoptysis, dyspnea (rest,exertional, paroxysmal nocturnal), gastrointestinal  bleeding (melena, rectal bleeding), abdominal pain, excessive heart burn, GU symptoms (dysuria, hematuria, voiding/incontinence issues) syncope, focal weakness, memory loss, numbness & tingling, skin/hair/nail changes, depression, anxiety, abnormal bruising/bleeding, musculoskeletal symptoms/signs.     Objective:   Physical Exam BP 121/74   Pulse (!) 56   Temp 98 F (36.7 C) (Oral)   Resp 17   Ht 6' (1.829 m)   Wt 219 lb 6 oz (99.5 kg)   SpO2 97%   BMI 29.75 kg/m   General Appearance:    Alert, cooperative, no distress, appears stated age  Head:    Normocephalic, without obvious abnormality, atraumatic  Eyes:    PERRL, conjunctiva/corneas clear, EOM's intact, fundi    benign, both eyes       Ears:    Normal TM's and external ear canals, both ears  Nose:   Nares normal,  septum midline, mucosa normal, no drainage   or sinus tenderness  Throat:   Lips, mucosa, and tongue normal; teeth and gums normal  Neck:   Supple, symmetrical, trachea midline, no adenopathy;       thyroid:  No enlargement/tenderness/nodules  Back:     Symmetric, no curvature, ROM normal, no CVA tenderness  Lungs:     Clear to auscultation bilaterally, respirations unlabored  Chest wall:    No tenderness or deformity  Heart:    Regular rate and rhythm, S1 and S2 normal, no murmur, rub   or gallop  Abdomen:     Soft, non-tender, bowel sounds active all four quadrants,    no masses, no organomegaly  Genitalia:    Normal male without lesion, discharge or tenderness  Rectal:    Normal tone, normal prostate, no masses or tenderness  Extremities:   Extremities normal, atraumatic, no cyanosis or edema  Pulses:   2+ and symmetric all extremities  Skin:   Skin color, texture, turgor normal, no rashes or lesions  Lymph nodes:   Cervical, supraclavicular, and axillary nodes normal  Neurologic:   CNII-XII intact. Normal strength, sensation and reflexes      throughout          Assessment & Plan:

## 2018-12-29 DIAGNOSIS — Z86010 Personal history of colonic polyps: Secondary | ICD-10-CM | POA: Diagnosis not present

## 2018-12-29 LAB — HM COLONOSCOPY

## 2019-01-01 DIAGNOSIS — H401213 Low-tension glaucoma, right eye, severe stage: Secondary | ICD-10-CM | POA: Diagnosis not present

## 2019-01-01 DIAGNOSIS — H401222 Low-tension glaucoma, left eye, moderate stage: Secondary | ICD-10-CM | POA: Diagnosis not present

## 2019-01-01 DIAGNOSIS — H25813 Combined forms of age-related cataract, bilateral: Secondary | ICD-10-CM | POA: Diagnosis not present

## 2019-01-03 ENCOUNTER — Encounter: Payer: Self-pay | Admitting: Family Medicine

## 2019-01-04 ENCOUNTER — Other Ambulatory Visit: Payer: Self-pay | Admitting: General Practice

## 2019-01-04 MED ORDER — ATORVASTATIN CALCIUM 10 MG PO TABS: 10.0000 mg | ORAL_TABLET | Freq: Every day | ORAL | 1 refills | Status: DC

## 2019-01-05 ENCOUNTER — Encounter: Payer: Self-pay | Admitting: General Practice

## 2019-01-21 ENCOUNTER — Ambulatory Visit: Payer: Medicare HMO | Admitting: Family Medicine

## 2019-01-28 ENCOUNTER — Ambulatory Visit (INDEPENDENT_AMBULATORY_CARE_PROVIDER_SITE_OTHER): Payer: Medicare HMO | Admitting: Family Medicine

## 2019-01-28 ENCOUNTER — Other Ambulatory Visit: Payer: Self-pay

## 2019-01-28 ENCOUNTER — Encounter: Payer: Self-pay | Admitting: Family Medicine

## 2019-01-28 VITALS — BP 120/80 | HR 49 | Temp 97.9°F | Resp 16 | Ht 72.0 in | Wt 230.0 lb

## 2019-01-28 DIAGNOSIS — E669 Obesity, unspecified: Secondary | ICD-10-CM

## 2019-01-28 DIAGNOSIS — E785 Hyperlipidemia, unspecified: Secondary | ICD-10-CM | POA: Diagnosis not present

## 2019-01-28 LAB — LIPID PANEL
Cholesterol: 167 mg/dL (ref 0–200)
HDL: 34.7 mg/dL — ABNORMAL LOW (ref >39.00)
LDL Cholesterol: 110 mg/dL — ABNORMAL HIGH (ref 0–99)
NonHDL: 132.42
Total CHOL/HDL Ratio: 5
Triglycerides: 111 mg/dL (ref 0.0–149.0)
VLDL: 22.2 mg/dL (ref 0.0–40.0)

## 2019-01-28 LAB — HEPATIC FUNCTION PANEL
ALT: 10 U/L (ref 0–53)
AST: 13 U/L (ref 0–37)
Albumin: 4.1 g/dL (ref 3.5–5.2)
Alkaline Phosphatase: 33 U/L — ABNORMAL LOW (ref 39–117)
Bilirubin, Direct: 0.2 mg/dL (ref 0.0–0.3)
Total Bilirubin: 1.2 mg/dL (ref 0.2–1.2)
Total Protein: 6.1 g/dL (ref 6.0–8.3)

## 2019-01-28 LAB — BASIC METABOLIC PANEL
BUN: 15 mg/dL (ref 6–23)
CO2: 24 mEq/L (ref 19–32)
Calcium: 8.7 mg/dL (ref 8.4–10.5)
Chloride: 105 mEq/L (ref 96–112)
Creatinine, Ser: 0.91 mg/dL (ref 0.40–1.50)
GFR: 83.44 mL/min (ref >60.00)
Glucose, Bld: 93 mg/dL (ref 70–99)
Potassium: 4.4 mEq/L (ref 3.5–5.1)
Sodium: 136 mEq/L (ref 135–145)

## 2019-01-28 LAB — CBC WITH DIFFERENTIAL/PLATELET
Basophils Absolute: 0 10*3/uL (ref 0.0–0.1)
Basophils Relative: 0.4 % (ref 0.0–3.0)
Eosinophils Absolute: 0 10*3/uL (ref 0.0–0.7)
Eosinophils Relative: 0.8 % (ref 0.0–5.0)
HCT: 42.6 % (ref 39.0–52.0)
Hemoglobin: 14.4 g/dL (ref 13.0–17.0)
Lymphocytes Relative: 29.7 % (ref 12.0–46.0)
Lymphs Abs: 1.2 10*3/uL (ref 0.7–4.0)
MCHC: 33.8 g/dL (ref 30.0–36.0)
MCV: 87.8 fl (ref 78.0–100.0)
Monocytes Absolute: 0.3 10*3/uL (ref 0.1–1.0)
Monocytes Relative: 7.6 % (ref 3.0–12.0)
Neutro Abs: 2.5 10*3/uL (ref 1.4–7.7)
Neutrophils Relative %: 61.5 % (ref 43.0–77.0)
Platelets: 108 10*3/uL — ABNORMAL LOW (ref 150.0–400.0)
RBC: 4.85 Mil/uL (ref 4.22–5.81)
RDW: 13.5 % (ref 11.5–15.5)
WBC: 4.1 10*3/uL (ref 4.0–10.5)

## 2019-01-28 LAB — TSH: TSH: 3.26 u[IU]/mL (ref 0.35–4.50)

## 2019-01-28 NOTE — Patient Instructions (Signed)
Schedule your complete physical in 6 months We'll notify you of your lab results and make any changes if needed Continue to work on healthy diet and regular exercise- you can do it! Call with any questions or concerns STAY SAFE!!!

## 2019-01-28 NOTE — Assessment & Plan Note (Signed)
Deteriorated.  Pt has gained 10 lbs.  Stressed need for healthy diet and regular exercise.  Check labs to risk stratify.  Will follow.

## 2019-01-28 NOTE — Assessment & Plan Note (Signed)
Chronic problem.  Tolerating statin w/o difficulty.  Stressed need for healthy diet and regular exercise.  Check labs.  Adjust meds prn  

## 2019-01-28 NOTE — Progress Notes (Signed)
   Subjective:    Patient ID: Jerome Scott, male    DOB: 29-Apr-1951, 68 y.o.   MRN: [id number]  HPI Hyperlipidemia- chronic problem, on Lipitor 10mg  daily.  Pt reports 'feeling fine'.  No CP, SOB, HAs, abd pain, N/V.  Obesity- pt has gained 10 lbs and BMI is now 31.19.  No regular exercise.  Not following a particular diet.   Review of Systems For ROS see HPI     Objective:   Physical Exam Vitals signs reviewed.  Constitutional:      General: He is not in acute distress.    Appearance: He is well-developed.  HENT:     Head: Normocephalic and atraumatic.  Eyes:     Conjunctiva/sclera: Conjunctivae normal.     Pupils: Pupils are equal, round, and reactive to light.  Neck:     Musculoskeletal: Normal range of motion and neck supple.     Thyroid: No thyromegaly.     Comments: L sided soft tissue mass Cardiovascular:     Rate and Rhythm: Normal rate and regular rhythm.     Heart sounds: Normal heart sounds. No murmur.  Pulmonary:     Effort: Pulmonary effort is normal. No respiratory distress.     Breath sounds: Normal breath sounds.  Abdominal:     General: Bowel sounds are normal. There is no distension.     Palpations: Abdomen is soft.  Lymphadenopathy:     Cervical: No cervical adenopathy.  Skin:    General: Skin is warm and dry.  Neurological:     Mental Status: He is alert and oriented to person, place, and time.     Cranial Nerves: No cranial nerve deficit.  Psychiatric:        Behavior: Behavior normal.           Assessment & Plan:

## 2019-02-15 DIAGNOSIS — H401213 Low-tension glaucoma, right eye, severe stage: Secondary | ICD-10-CM | POA: Diagnosis not present

## 2019-02-15 DIAGNOSIS — H401222 Low-tension glaucoma, left eye, moderate stage: Secondary | ICD-10-CM | POA: Diagnosis not present

## 2019-07-28 ENCOUNTER — Other Ambulatory Visit: Payer: Self-pay

## 2019-07-29 ENCOUNTER — Encounter: Payer: Self-pay | Admitting: Family Medicine

## 2019-07-29 ENCOUNTER — Ambulatory Visit (INDEPENDENT_AMBULATORY_CARE_PROVIDER_SITE_OTHER): Payer: Medicare HMO | Admitting: Family Medicine

## 2019-07-29 ENCOUNTER — Other Ambulatory Visit: Payer: Self-pay | Admitting: General Practice

## 2019-07-29 VITALS — BP 124/82 | HR 76 | Temp 98.0°F | Resp 16 | Ht 72.0 in | Wt 252.2 lb

## 2019-07-29 DIAGNOSIS — Z23 Encounter for immunization: Secondary | ICD-10-CM

## 2019-07-29 DIAGNOSIS — E039 Hypothyroidism, unspecified: Secondary | ICD-10-CM

## 2019-07-29 DIAGNOSIS — E669 Obesity, unspecified: Secondary | ICD-10-CM

## 2019-07-29 DIAGNOSIS — E785 Hyperlipidemia, unspecified: Secondary | ICD-10-CM

## 2019-07-29 DIAGNOSIS — Z125 Encounter for screening for malignant neoplasm of prostate: Secondary | ICD-10-CM

## 2019-07-29 DIAGNOSIS — Z0000 Encounter for general adult medical examination without abnormal findings: Secondary | ICD-10-CM | POA: Diagnosis not present

## 2019-07-29 LAB — BASIC METABOLIC PANEL WITH GFR
BUN: 13 mg/dL (ref 6–23)
CO2: 28 meq/L (ref 19–32)
Calcium: 9 mg/dL (ref 8.4–10.5)
Chloride: 104 meq/L (ref 96–112)
Creatinine, Ser: 0.97 mg/dL (ref 0.40–1.50)
GFR: 77.39 mL/min
Glucose, Bld: 95 mg/dL (ref 70–99)
Potassium: 4.5 meq/L (ref 3.5–5.1)
Sodium: 137 meq/L (ref 135–145)

## 2019-07-29 LAB — CBC WITH DIFFERENTIAL/PLATELET
Basophils Absolute: 0 K/uL (ref 0.0–0.1)
Basophils Relative: 1 % (ref 0.0–3.0)
Eosinophils Absolute: 0.1 K/uL (ref 0.0–0.7)
Eosinophils Relative: 1.5 % (ref 0.0–5.0)
HCT: 37.3 % — ABNORMAL LOW (ref 39.0–52.0)
Hemoglobin: 12.3 g/dL — ABNORMAL LOW (ref 13.0–17.0)
Lymphocytes Relative: 36.3 % (ref 12.0–46.0)
Lymphs Abs: 1.6 K/uL (ref 0.7–4.0)
MCHC: 33.1 g/dL (ref 30.0–36.0)
MCV: 85.5 fl (ref 78.0–100.0)
Monocytes Absolute: 0.4 K/uL (ref 0.1–1.0)
Monocytes Relative: 8.3 % (ref 3.0–12.0)
Neutro Abs: 2.4 K/uL (ref 1.4–7.7)
Neutrophils Relative %: 52.9 % (ref 43.0–77.0)
Platelets: 124 K/uL — ABNORMAL LOW (ref 150.0–400.0)
RBC: 4.37 Mil/uL (ref 4.22–5.81)
RDW: 14 % (ref 11.5–15.5)
WBC: 4.5 K/uL (ref 4.0–10.5)

## 2019-07-29 LAB — HEPATIC FUNCTION PANEL
ALT: 15 U/L (ref 0–53)
AST: 17 U/L (ref 0–37)
Albumin: 4.1 g/dL (ref 3.5–5.2)
Alkaline Phosphatase: 37 U/L — ABNORMAL LOW (ref 39–117)
Bilirubin, Direct: 0.1 mg/dL (ref 0.0–0.3)
Total Bilirubin: 0.9 mg/dL (ref 0.2–1.2)
Total Protein: 6.5 g/dL (ref 6.0–8.3)

## 2019-07-29 LAB — LIPID PANEL
Cholesterol: 181 mg/dL (ref 0–200)
HDL: 35.3 mg/dL — ABNORMAL LOW
LDL Cholesterol: 114 mg/dL — ABNORMAL HIGH (ref 0–99)
NonHDL: 145.82
Total CHOL/HDL Ratio: 5
Triglycerides: 158 mg/dL — ABNORMAL HIGH (ref 0.0–149.0)
VLDL: 31.6 mg/dL (ref 0.0–40.0)

## 2019-07-29 LAB — PSA, MEDICARE: PSA: 3.23 ng/mL (ref 0.10–4.00)

## 2019-07-29 LAB — TSH: TSH: 4.87 u[IU]/mL — ABNORMAL HIGH (ref 0.35–4.50)

## 2019-07-29 MED ORDER — LEVOTHYROXINE SODIUM 50 MCG PO TABS: 50.0000 ug | ORAL_TABLET | Freq: Every day | ORAL | 6 refills | Status: DC

## 2019-07-29 NOTE — Patient Instructions (Addendum)
Follow up in 6 months to recheck cholesterol and weight loss progress We'll notify you of your lab results and make any changes if needed Continue to work on healthy diet and regular exercise- you can do it! Call with any questions or concerns Stay Safe!  Stay Healthy!   Preventive Care 63 Years and Older, Male Preventive care refers to lifestyle choices and visits with your health care provider that can promote health and wellness. This includes:  A yearly physical exam. This is also called an annual well check.  Regular dental and eye exams.  Immunizations.  Screening for certain conditions.  Healthy lifestyle choices, such as diet and exercise. What can I expect for my preventive care visit? Physical exam Your health care provider will check:  Height and weight. These may be used to calculate body mass index (BMI), which is a measurement that tells if you are at a healthy weight.  Heart rate and blood pressure.  Your skin for abnormal spots. Counseling Your health care provider may ask you questions about:  Alcohol, tobacco, and drug use.  Emotional well-being.  Home and relationship well-being.  Sexual activity.  Eating habits.  History of falls.  Memory and ability to understand (cognition).  Work and work Statistician. What immunizations do I need?  Influenza (flu) vaccine  This is recommended every year. Tetanus, diphtheria, and pertussis (Tdap) vaccine  You may need a Td booster every 10 years. Varicella (chickenpox) vaccine  You may need this vaccine if you have not already been vaccinated. Zoster (shingles) vaccine  You may need this after age 80. Pneumococcal conjugate (PCV13) vaccine  One dose is recommended after age 40. Pneumococcal polysaccharide (PPSV23) vaccine  One dose is recommended after age 40. Measles, mumps, and rubella (MMR) vaccine  You may need at least one dose of MMR if you were born in 1957 or later. You may also need a  second dose. Meningococcal conjugate (MenACWY) vaccine  You may need this if you have certain conditions. Hepatitis A vaccine  You may need this if you have certain conditions or if you travel or work in places where you may be exposed to hepatitis A. Hepatitis B vaccine  You may need this if you have certain conditions or if you travel or work in places where you may be exposed to hepatitis B. Haemophilus influenzae type b (Hib) vaccine  You may need this if you have certain conditions. You may receive vaccines as individual doses or as more than one vaccine together in one shot (combination vaccines). Talk with your health care provider about the risks and benefits of combination vaccines. What tests do I need? Blood tests  Lipid and cholesterol levels. These may be checked every 5 years, or more frequently depending on your overall health.  Hepatitis C test.  Hepatitis B test. Screening  Lung cancer screening. You may have this screening every year starting at age 35 if you have a 30-pack-year history of smoking and currently smoke or have quit within the past 15 years.  Colorectal cancer screening. All adults should have this screening starting at age 59 and continuing until age 60. Your health care provider may recommend screening at age 13 if you are at increased risk. You will have tests every 1-10 years, depending on your results and the type of screening test.  Prostate cancer screening. Recommendations will vary depending on your family history and other risks.  Diabetes screening. This is done by checking your blood sugar (glucose)  after you have not eaten for a while (fasting). You may have this done every 1-3 years.  Abdominal aortic aneurysm (AAA) screening. You may need this if you are a current or former smoker.  Sexually transmitted disease (STD) testing. Follow these instructions at home: Eating and drinking  Eat a diet that includes fresh fruits and  vegetables, whole grains, lean protein, and low-fat dairy products. Limit your intake of foods with high amounts of sugar, saturated fats, and salt.  Take vitamin and mineral supplements as recommended by your health care provider.  Do not drink alcohol if your health care provider tells you not to drink.  If you drink alcohol: ? Limit how much you have to 0-2 drinks a day. ? Be aware of how much alcohol is in your drink. In the [place] one drink equals one 12 oz bottle of beer (355 mL), one 5 oz glass of wine (148 mL), or one 1 oz glass of hard liquor (44 mL). Lifestyle  Take daily care of your teeth and gums.  Stay active. Exercise for at least 30 minutes on 5 or more days each week.  Do not use any products that contain nicotine or tobacco, such as cigarettes, e-cigarettes, and chewing tobacco. If you need help quitting, ask your health care provider.  If you are sexually active, practice safe sex. Use a condom or other form of protection to prevent STIs (sexually transmitted infections).  Talk with your health care provider about taking a low-dose aspirin or statin. What's next?  Visit your health care provider once a year for a well check visit.  Ask your health care provider how often you should have your eyes and teeth checked.  Stay up to date on all vaccines. This information is not intended to replace advice given to you by your health care provider. Make sure you discuss any questions you have with your health care provider. Document Revised: 06/25/2018 Document Reviewed: 06/25/2018 Elsevier Patient Education  2020 [employer].

## 2019-07-29 NOTE — Addendum Note (Signed)
Addended by: Davis Gourd on: 07/29/2019 08:51 AM   Modules accepted: Orders

## 2019-07-29 NOTE — Assessment & Plan Note (Signed)
Pt's PE WNL w/ exception of obesity and known umbilical hernia.  UTD on colonoscopy.  Pneumovax given today.  Declines flu.  Discussed need for healthy diet, regular exercise, COVID safety.  Check labs.

## 2019-07-29 NOTE — Assessment & Plan Note (Signed)
Chronic problem.  Tolerating statin w/o difficulty.  Check labs.  Adjust meds prn  

## 2019-07-29 NOTE — Progress Notes (Signed)
Subjective:    Patient ID: Jerome Scott, male    DOB: 1951/01/13, 69 y.o.   MRN: [id number]  HPI Here today for CPE and MWV.  No concerns  Risk Factors: Hyperlipidemia- chronic problem, on Lipitor 10mg  daily Obesity- ongoing issue.  Pt has gained 22 lbs since last visit.  [facility]  BMI is now 34.21 Physical Activity: no regular exercise Fall Risk: low Depression: denies Hearing: normal to conversational tones, mildly decreased to whispered voice ADL's: independent Cognitive: normal linear thought process, memory and attention intact Home Safety: safe at home, lives w/ wife Bethena Roys) Height, Weight, BMI, Visual Acuity: see vitals, vision corrected to 20/20 w/ glasses Counseling: UTD on Tdap, colonoscopy.  Due for pneumovax, declines flu. Labs Ordered: See A&P Care Plan: See A&P   Health Maintenance  Topic Date Due  . PNA vac Low Risk Adult (2 of 2 - PPSV23) 07/24/2019  . INFLUENZA VACCINE  10/13/2019 (Originally 02/13/2019)  . Hepatitis C Screening  07/28/2020 (Originally March 15, 1951)  . TETANUS/TDAP  01/31/2021  . COLONOSCOPY  12/29/2023     Patient Care Team    Relationship Specialty Notifications Start End  Midge Minium, MD PCP - General Family Medicine  07/21/17   Laurence Spates, MD Consulting Physician Gastroenterology  07/29/19       Review of Systems Patient reports no vision/hearing changes, anorexia, fever ,adenopathy, persistant/recurrent hoarseness, swallowing issues, chest pain, palpitations, edema, persistant/recurrent cough, hemoptysis, dyspnea (rest,exertional, paroxysmal nocturnal), gastrointestinal  bleeding (melena, rectal bleeding), abdominal pain, excessive heart burn, GU symptoms (dysuria, hematuria, voiding/incontinence issues) syncope, focal weakness, memory loss, numbness & tingling, skin/hair/nail changes, depression, anxiety, abnormal bruising/bleeding, musculoskeletal symptoms/signs.  This visit occurred during the SARS-CoV-2  public health emergency.  Safety protocols were in place, including screening questions prior to the visit, additional usage of staff PPE, and extensive cleaning of exam room while observing appropriate contact time as indicated for disinfecting solutions.       Objective:   Physical Exam BP 124/82   Pulse 76   Temp 98 F (36.7 C) (Tympanic)   Resp 16   Ht 6' (1.829 m)   Wt 252 lb 4 oz (114.4 kg)   SpO2 97%   BMI 34.21 kg/m   General Appearance:    Alert, cooperative, no distress, appears stated age  Head:    Normocephalic, without obvious abnormality, atraumatic  Eyes:    PERRL, conjunctiva/corneas clear, EOM's intact, fundi    benign, both eyes       Ears:    Normal TM's and external ear canals, both ears  Nose:   Nares normal, septum midline, mucosa normal, no drainage   or sinus tenderness  Throat:   Lips, mucosa, and tongue normal; teeth and gums normal  Neck:   Supple, symmetrical, trachea midline, no adenopathy;       thyroid:  No enlargement/tenderness/nodules; no carotid   bruit or JVD  Back:     Symmetric, no curvature, ROM normal, no CVA tenderness  Lungs:     Clear to auscultation bilaterally, respirations unlabored  Chest wall:    No tenderness or deformity  Heart:    Regular rate and rhythm, S1 and S2 normal, no murmur, rub   or gallop  Abdomen:     Soft, non-tender, bowel sounds active all four quadrants,    no masses, no organomegaly.  + umbilical hernia and diastasis recti  Genitalia:    Normal male without lesion, discharge or tenderness  Rectal:    Normal tone, normal prostate, no masses or tenderness  Extremities:   Extremities normal, atraumatic, no cyanosis or edema  Pulses:   2+ and symmetric all extremities  Skin:   Skin color, texture, turgor normal, no rashes or lesions  Lymph nodes:   Cervical, supraclavicular, and axillary nodes normal  Neurologic:   CNII-XII intact. Normal strength, sensation and reflexes      throughout          Assessment  & Plan:

## 2019-07-29 NOTE — Assessment & Plan Note (Signed)
Deteriorated.  Pt has gained 20 lbs since last visit.  Stressed need for healthy diet and regular exercise.  Check labs to risk stratify.  Will follow.

## 2019-08-30 ENCOUNTER — Ambulatory Visit (INDEPENDENT_AMBULATORY_CARE_PROVIDER_SITE_OTHER): Payer: Medicare HMO | Admitting: *Deleted

## 2019-08-30 ENCOUNTER — Other Ambulatory Visit: Payer: Self-pay

## 2019-08-30 DIAGNOSIS — E039 Hypothyroidism, unspecified: Secondary | ICD-10-CM

## 2019-08-30 LAB — TSH: TSH: 3.28 u[IU]/mL (ref 0.35–4.50)

## 2019-08-31 DIAGNOSIS — H401213 Low-tension glaucoma, right eye, severe stage: Secondary | ICD-10-CM | POA: Diagnosis not present

## 2019-08-31 DIAGNOSIS — H401222 Low-tension glaucoma, left eye, moderate stage: Secondary | ICD-10-CM | POA: Diagnosis not present

## 2019-09-10 ENCOUNTER — Ambulatory Visit: Payer: Medicare HMO

## 2019-10-01 ENCOUNTER — Other Ambulatory Visit: Payer: Self-pay | Admitting: Family Medicine

## 2020-01-22 ENCOUNTER — Other Ambulatory Visit: Payer: Self-pay | Admitting: Family Medicine

## 2020-03-25 ENCOUNTER — Other Ambulatory Visit: Payer: Self-pay | Admitting: Family Medicine

## 2020-03-28 DIAGNOSIS — H401213 Low-tension glaucoma, right eye, severe stage: Secondary | ICD-10-CM | POA: Diagnosis not present

## 2020-03-28 DIAGNOSIS — H401222 Low-tension glaucoma, left eye, moderate stage: Secondary | ICD-10-CM | POA: Diagnosis not present

## 2020-07-13 ENCOUNTER — Telehealth: Payer: Self-pay | Admitting: Family Medicine

## 2020-07-13 NOTE — Telephone Encounter (Signed)
Left message for patient to schedule Annual Wellness Visit.  Please schedule with Nurse Health Advisor Martha Stanley, RN at [facility]  

## 2020-08-14 ENCOUNTER — Ambulatory Visit (INDEPENDENT_AMBULATORY_CARE_PROVIDER_SITE_OTHER): Payer: Medicare HMO

## 2020-08-14 VITALS — Ht 72.0 in | Wt 252.0 lb

## 2020-08-14 DIAGNOSIS — Z0000 Encounter for general adult medical examination without abnormal findings: Secondary | ICD-10-CM

## 2020-08-14 NOTE — Patient Instructions (Signed)
[name] , Thank you for taking time to come for your Medicare Wellness Visit. I appreciate your ongoing commitment to your health goals. Please review the following plan we discussed and let me know if I can assist you in the future.   Screening recommendations/referrals: Colonoscopy: Completed 12/29/2018-Due 12/29/2023 Recommended yearly ophthalmology/optometry visit for glaucoma screening and checkup Recommended yearly dental visit for hygiene and checkup  Vaccinations: Influenza vaccine: Declined Pneumococcal vaccine: Completed vaccines Tdap vaccine: Up to date-Due-01/31/2021 Shingles vaccine: Discuss with pharmacy  Covid-19: Completed vaccines  Advanced directives: Please bring a copy for your chart  Conditions/risks identified: See problem list  Next appointment: Follow up in one year for your annual wellness visit. 08/20/21 @ 10:30  Preventive Care 68 Years and Older, Male Preventive care refers to lifestyle choices and visits with your health care provider that can promote health and wellness. What does preventive care include?  A yearly physical exam. This is also called an annual well check.  Dental exams once or twice a year.  Routine eye exams. Ask your health care provider how often you should have your eyes checked.  Personal lifestyle choices, including:  Daily care of your teeth and gums.  Regular physical activity.  Eating a healthy diet.  Avoiding tobacco and drug use.  Limiting alcohol use.  Practicing safe sex.  Taking low doses of aspirin every day.  Taking vitamin and mineral supplements as recommended by your health care provider. What happens during an annual well check? The services and screenings done by your health care provider during your annual well check will depend on your age, overall health, lifestyle risk factors, and family history of disease. Counseling  Your health care provider may ask you questions about your:  Alcohol  use.  Tobacco use.  Drug use.  Emotional well-being.  Home and relationship well-being.  Sexual activity.  Eating habits.  History of falls.  Memory and ability to understand (cognition).  Work and work Statistician. Screening  You may have the following tests or measurements:  Height, weight, and BMI.  Blood pressure.  Lipid and cholesterol levels. These may be checked every 5 years, or more frequently if you are over 30 years old.  Skin check.  Lung cancer screening. You may have this screening every year starting at age 17 if you have a 30-pack-year history of smoking and currently smoke or have quit within the past 15 years.  Fecal occult blood test (FOBT) of the stool. You may have this test every year starting at age 25.  Flexible sigmoidoscopy or colonoscopy. You may have a sigmoidoscopy every 5 years or a colonoscopy every 10 years starting at age 26.  Prostate cancer screening. Recommendations will vary depending on your family history and other risks.  Hepatitis C blood test.  Hepatitis B blood test.  Sexually transmitted disease (STD) testing.  Diabetes screening. This is done by checking your blood sugar (glucose) after you have not eaten for a while (fasting). You may have this done every 1-3 years.  Abdominal aortic aneurysm (AAA) screening. You may need this if you are a current or former smoker.  Osteoporosis. You may be screened starting at age 73 if you are at high risk. Talk with your health care provider about your test results, treatment options, and if necessary, the need for more tests. Vaccines  Your health care provider may recommend certain vaccines, such as:  Influenza vaccine. This is recommended every year.  Tetanus, diphtheria, and acellular pertussis (Tdap,  Td) vaccine. You may need a Td booster every 10 years.  Zoster vaccine. You may need this after age 41.  Pneumococcal 13-valent conjugate (PCV13) vaccine. One dose is  recommended after age 96.  Pneumococcal polysaccharide (PPSV23) vaccine. One dose is recommended after age 6. Talk to your health care provider about which screenings and vaccines you need and how often you need them. This information is not intended to replace advice given to you by your health care provider. Make sure you discuss any questions you have with your health care provider. Document Released: 07/28/2015 Document Revised: 03/20/2016 Document Reviewed: 05/02/2015 Elsevier Interactive Patient Education  2017 [place] Prevention in the Home Falls can cause injuries. They can happen to people of all ages. There are many things you can do to make your home safe and to help prevent falls. What can I do on the outside of my home?  Regularly fix the edges of walkways and driveways and fix any cracks.  Remove anything that might make you trip as you walk through a door, such as a raised step or threshold.  Trim any bushes or trees on the path to your home.  Use bright outdoor lighting.  Clear any walking paths of anything that might make someone trip, such as rocks or tools.  Regularly check to see if handrails are loose or broken. Make sure that both sides of any steps have handrails.  Any raised decks and porches should have guardrails on the edges.  Have any leaves, snow, or ice cleared regularly.  Use sand or salt on walking paths during winter.  Clean up any spills in your garage right away. This includes oil or grease spills. What can I do in the bathroom?  Use night lights.  Install grab bars by the toilet and in the tub and shower. Do not use towel bars as grab bars.  Use non-skid mats or decals in the tub or shower.  If you need to sit down in the shower, use a plastic, non-slip stool.  Keep the floor dry. Clean up any water that spills on the floor as soon as it happens.  Remove soap buildup in the tub or shower regularly.  Attach bath mats  securely with double-sided non-slip rug tape.  Do not have throw rugs and other things on the floor that can make you trip. What can I do in the bedroom?  Use night lights.  Make sure that you have a light by your bed that is easy to reach.  Do not use any sheets or blankets that are too big for your bed. They should not hang down onto the floor.  Have a firm chair that has side arms. You can use this for support while you get dressed.  Do not have throw rugs and other things on the floor that can make you trip. What can I do in the kitchen?  Clean up any spills right away.  Avoid walking on wet floors.  Keep items that you use a lot in easy-to-reach places.  If you need to reach something above you, use a strong step stool that has a grab bar.  Keep electrical cords out of the way.  Do not use floor polish or wax that makes floors slippery. If you must use wax, use non-skid floor wax.  Do not have throw rugs and other things on the floor that can make you trip. What can I do with my stairs?  Do not  leave any items on the stairs.  Make sure that there are handrails on both sides of the stairs and use them. Fix handrails that are broken or loose. Make sure that handrails are as long as the stairways.  Check any carpeting to make sure that it is firmly attached to the stairs. Fix any carpet that is loose or worn.  Avoid having throw rugs at the top or bottom of the stairs. If you do have throw rugs, attach them to the floor with carpet tape.  Make sure that you have a light switch at the top of the stairs and the bottom of the stairs. If you do not have them, ask someone to add them for you. What else can I do to help prevent falls?  Wear shoes that:  Do not have high heels.  Have rubber bottoms.  Are comfortable and fit you well.  Are closed at the toe. Do not wear sandals.  If you use a stepladder:  Make sure that it is fully opened. Do not climb a closed  stepladder.  Make sure that both sides of the stepladder are locked into place.  Ask someone to hold it for you, if possible.  Clearly mark and make sure that you can see:  Any grab bars or handrails.  First and last steps.  Where the edge of each step is.  Use tools that help you move around (mobility aids) if they are needed. These include:  Canes.  Walkers.  Scooters.  Crutches.  Turn on the lights when you go into a dark area. Replace any light bulbs as soon as they burn out.  Set up your furniture so you have a clear path. Avoid moving your furniture around.  If any of your floors are uneven, fix them.  If there are any pets around you, be aware of where they are.  Review your medicines with your doctor. Some medicines can make you feel dizzy. This can increase your chance of falling. Ask your doctor what other things that you can do to help prevent falls. This information is not intended to replace advice given to you by your health care provider. Make sure you discuss any questions you have with your health care provider. Document Released: 04/27/2009 Document Revised: 12/07/2015 Document Reviewed: 08/05/2014 Elsevier Interactive Patient Education  2017 [employer].

## 2020-08-14 NOTE — Progress Notes (Signed)
Subjective:   Jerome Scott is a 70 y.o. male who presents for Medicare Annual/Subsequent preventive examination.   I connected with Jerome Scott today by telephone and verified that I am speaking with the correct person using two identifiers. Location patient: home Location provider: work Persons participating in the virtual visit: patient, Marine scientist.    I discussed the limitations, risks, security and privacy concerns of performing an evaluation and management service by telephone and the availability of in person appointments. I also discussed with the patient that there may be a patient responsible charge related to this service. The patient expressed understanding and verbally consented to this telephonic visit.    Interactive audio and video telecommunications were attempted between this provider and patient, however failed, due to patient having technical difficulties OR patient did not have access to video capability.  We continued and completed visit with audio only.  Some vital signs may be absent or patient reported.   Time Spent with patient on telephone encounter: 20 minutes   Review of Systems     Cardiac Risk Factors include: advanced age (>17men, >62 women);dyslipidemia;obesity (BMI >30kg/m2)     Objective:    Today's Vitals   08/14/20 1038  Weight: 252 lb (114.3 kg)  Height: 6' (1.829 m)   Body mass index is 34.18 kg/m.  Advanced Directives 08/14/2020 07/23/2018  Does Patient Have a Medical Advance Directive? No;Yes Yes  Type of Paramedic of [place];Living will [place];Living will  Does patient want to make changes to medical advance directive? - No - Patient declined  Copy of [place] in Chart? No - copy requested No - copy requested    Current Medications (verified) Outpatient Encounter Medications as of 08/14/2020  Medication Sig  . atorvastatin (LIPITOR) 10 MG tablet TAKE 1 TABLET BY MOUTH EVERY  DAY  . dorzolamide-timolol (COSOPT) 22.3-6.8 MG/ML ophthalmic solution INSTILL 1 DROP INTO BOTH EYES TWICE A DAY  . latanoprost (XALATAN) 0.005 % ophthalmic solution INSTILL 1 DROP INTO BOTH EYES EVERY DAY IN THE EVENING  . levothyroxine (SYNTHROID) 50 MCG tablet TAKE 1 TABLET BY MOUTH EVERY DAY  . omeprazole (PRILOSEC) 20 MG capsule    No facility-administered encounter medications on file as of 08/14/2020.    Allergies (verified) Patient has no known allergies.   History: Past Medical History:  Diagnosis Date  . Blood in stool   . GERD (gastroesophageal reflux disease)    History reviewed. No pertinent surgical history. Family History  Problem Relation Age of Onset  . Hyperlipidemia Mother   . Hypertension Mother   . Heart disease Father   . Cancer Father        brain tumor  . Diabetes Father   . Heart attack Father   . Diabetes Sister   . Heart attack Sister   . Hyperlipidemia Sister   . Hypertension Sister   . Diabetes Brother   . Alcohol abuse Daughter   . Heart attack Maternal Grandmother   . Heart attack Maternal Grandfather   . Alcohol abuse Paternal Grandfather   . Heart attack Brother   . Hyperlipidemia Brother   . Hypertension Brother   . Heart attack Brother   . Hyperlipidemia Brother   . Hypertension Brother    Social History   Socioeconomic History  . Marital status: Married    Spouse name: Not on file  . Number of children: Not on file  . Years of education: Not on file  .  Highest education level: Not on file  Occupational History  . Not on file  Tobacco Use  . Smoking status: Never Smoker  . Smokeless tobacco: Never Used  Vaping Use  . Vaping Use: Never used  Substance and Sexual Activity  . Alcohol use: No  . Drug use: No  . Sexual activity: Yes  Other Topics Concern  . Not on file  Social History Narrative  . Not on file   Social Determinants of Health   Financial Resource Strain: Low Risk   . Difficulty of Paying Living  Expenses: Not hard at all  Food Insecurity: No Food Insecurity  . Worried About Charity fundraiser in the Last Year: Never true  . Ran Out of Food in the Last Year: Never true  Transportation Needs: No Transportation Needs  . Lack of Transportation (Medical): No  . Lack of Transportation (Non-Medical): No  Physical Activity: Insufficiently Active  . Days of Exercise per Week: 3 days  . Minutes of Exercise per Session: 40 min  Stress: No Stress Concern Present  . Feeling of Stress : Not at all  Social Connections: Socially Integrated  . Frequency of Communication with Friends and Family: More than three times a week  . Frequency of Social Gatherings with Friends and Family: More than three times a week  . Attends Religious Services: More than 4 times per year  . Active Member of Clubs or Organizations: Yes  . Attends Archivist Meetings: 1 to 4 times per year  . Marital Status: Married    Tobacco Counseling Counseling given: Not Answered   Clinical Intake:  Pre-visit preparation completed: Yes  Pain : No/denies pain     Nutritional Status: BMI > 30  Obese Nutritional Risks: None Diabetes: No  How often do you need to have someone help you when you read instructions, pamphlets, or other written materials from your doctor or pharmacy?: 1 - Never  Diabetic?No  Interpreter Needed?: No  Information entered by :: Caroleen Hamman LPN   Activities of Daily Living In your present state of health, do you have any difficulty performing the following activities: 08/14/2020  Hearing? N  Vision? N  Difficulty concentrating or making decisions? N  Walking or climbing stairs? N  Dressing or bathing? N  Doing errands, shopping? N  Preparing Food and eating ? N  Using the Toilet? N  In the past six months, have you accidently leaked urine? N  Do you have problems with loss of bowel control? N  Managing your Medications? N  Managing your Finances? N  Housekeeping or  managing your Housekeeping? N  Some recent data might be hidden    Patient Care Team: Midge Minium, MD as PCP - General (Family Medicine) Laurence Spates, MD (Inactive) as Consulting Physician (Gastroenterology)  Indicate any recent Medical Services you may have received from other than [facility] providers in the past year (date may be approximate).     Assessment:   This is a routine wellness examination for Berlyn.  Hearing/Vision screen  Hearing Screening   125Hz  250Hz  500Hz  1000Hz  2000Hz  3000Hz  4000Hz  6000Hz  8000Hz   Right ear:           Left ear:           Comments: No issues  Vision Screening Comments: Wears glasses Last eye exam-2021  Dietary issues and exercise activities discussed: Current Exercise Habits: Home exercise routine, Type of exercise: strength training/weights;treadmill, Time (Minutes): 45, Frequency (Times/Week): 3, Weekly Exercise (  Minutes/Week): 135, Intensity: Mild, Exercise limited by: None identified  Goals    . Patient Stated     Increase activity      Depression Screen PHQ 2/9 Scores 08/14/2020 07/29/2019 01/28/2019 07/23/2018 01/20/2018 07/21/2017 01/31/2014  PHQ - 2 Score 0 0 0 0 0 0 0  PHQ- 9 Score - 0 0 0 0 0 -    Fall Risk Fall Risk  08/14/2020 07/29/2019 07/29/2019 01/28/2019 07/23/2018  Falls in the past year? 0 0 0 0 0  Number falls in past yr: 0 0 0 0 -  Injury with Fall? 0 0 0 0 -  Follow up Falls prevention discussed Falls evaluation completed Falls evaluation completed - -    FALL RISK PREVENTION PERTAINING TO THE HOME:  Any stairs in or around the home? Yes  If so, are there any without handrails? No  Home free of loose throw rugs in walkways, pet beds, electrical cords, etc? Yes  Adequate lighting in your home to reduce risk of falls? Yes   ASSISTIVE DEVICES UTILIZED TO PREVENT FALLS:  Life alert? No  Use of a cane, walker or w/c? No  Grab bars in the bathroom? No  Shower chair or bench in shower? No  Elevated toilet seat or a  handicapped toilet? No   TIMED UP AND GO:  Was the test performed? No . Phone visit   Cognitive Function:Normal cognitive status assessed by  this Nurse Health Advisor. No abnormalities found.          Immunizations Immunization History  Administered Date(s) Administered  . Moderna Sars-Covid-2 Vaccination 08/13/2019, 09/10/2019, 06/14/2020  . Pneumococcal Conjugate-13 07/23/2018  . Pneumococcal Polysaccharide-23 07/29/2019  . Tdap 02/01/2011    TDAP status: Up to date  Flu Vaccine status: Declined, Education has been provided regarding the importance of this vaccine but patient still declined. Advised may receive this vaccine at local pharmacy or Health Dept. Aware to provide a copy of the vaccination record if obtained from local pharmacy or Health Dept. Verbalized acceptance and understanding.  Pneumococcal vaccine status: Up to date  Covid-19 vaccine status: Completed vaccines  Qualifies for Shingles Vaccine? Yes   Zostavax completed No   Shingrix Completed?: No.    Education has been provided regarding the importance of this vaccine. Patient has been advised to call insurance company to determine out of pocket expense if they have not yet received this vaccine. Advised may also receive vaccine at local pharmacy or Health Dept. Verbalized acceptance and understanding.  Screening Tests Health Maintenance  Topic Date Due  . Hepatitis C Screening  Never done  . INFLUENZA VACCINE  10/12/2020 (Originally 02/13/2020)  . TETANUS/TDAP  01/31/2021  . COLONOSCOPY (Pts 45-58yrs Insurance coverage will need to be confirmed)  12/29/2023  . COVID-19 Vaccine  Completed  . PNA vac Low Risk Adult  Completed    Health Maintenance  Health Maintenance Due  Topic Date Due  . Hepatitis C Screening  Never done    Colorectal cancer screening: Type of screening: Colonoscopy. Completed 12/29/2018. Repeat every 5 years  Lung Cancer Screening: (Low Dose CT Chest recommended if Age 62-80  years, 30 pack-year currently smoking OR have quit w/in 15years.) does not qualify.     Additional Screening:  Hepatitis C Screening: does qualify; Discuss with PCP  Vision Screening: Recommended annual ophthalmology exams for early detection of glaucoma and other disorders of the eye. Is the patient up to date with their annual eye exam?  Yes  Who is  the provider or what is the name of the office in which the patient attends annual eye exams? Unsure of name   Dental Screening: Recommended annual dental exams for proper oral hygiene  Community Resource Referral / Chronic Care Management: CRR required this visit?  No   CCM required this visit?  No      Plan:     I have personally reviewed and noted the following in the patient's chart:   . Medical and social history . Use of alcohol, tobacco or illicit drugs  . Current medications and supplements . Functional ability and status . Nutritional status . Physical activity . Advanced directives . List of other physicians . Hospitalizations, surgeries, and ER visits in previous 12 months . Vitals . Screenings to include cognitive, depression, and falls . Referrals and appointments  In addition, I have reviewed and discussed with patient certain preventive protocols, quality metrics, and best practice recommendations. A written personalized care plan for preventive services as well as general preventive health recommendations were provided to patient.   Due to this being a telephonic visit, the after visit summary with patients personalized plan was offered to patient via mail or my-chart.  Patient would like to access on my-chart.   Marta Antu, LPN   [id number]  Nurse Health Advisor  Nurse Notes: None

## 2020-08-15 ENCOUNTER — Ambulatory Visit (INDEPENDENT_AMBULATORY_CARE_PROVIDER_SITE_OTHER): Payer: Medicare HMO | Admitting: Family Medicine

## 2020-08-15 ENCOUNTER — Other Ambulatory Visit: Payer: Self-pay

## 2020-08-15 ENCOUNTER — Encounter: Payer: Self-pay | Admitting: Family Medicine

## 2020-08-15 VITALS — BP 122/70 | HR 53 | Temp 97.8°F | Resp 17 | Ht 72.0 in | Wt 255.4 lb

## 2020-08-15 DIAGNOSIS — Z125 Encounter for screening for malignant neoplasm of prostate: Secondary | ICD-10-CM

## 2020-08-15 DIAGNOSIS — E669 Obesity, unspecified: Secondary | ICD-10-CM | POA: Diagnosis not present

## 2020-08-15 DIAGNOSIS — Z0000 Encounter for general adult medical examination without abnormal findings: Secondary | ICD-10-CM

## 2020-08-15 DIAGNOSIS — Z1159 Encounter for screening for other viral diseases: Secondary | ICD-10-CM

## 2020-08-15 LAB — BASIC METABOLIC PANEL
BUN: 11 mg/dL (ref 6–23)
CO2: 27 mEq/L (ref 19–32)
Calcium: 9.1 mg/dL (ref 8.4–10.5)
Chloride: 104 mEq/L (ref 96–112)
Creatinine, Ser: 0.99 mg/dL (ref 0.40–1.50)
GFR: 78.98 mL/min (ref >60.00)
Glucose, Bld: 84 mg/dL (ref 70–99)
Potassium: 4.2 mEq/L (ref 3.5–5.1)
Sodium: 136 mEq/L (ref 135–145)

## 2020-08-15 LAB — CBC WITH DIFFERENTIAL/PLATELET
Basophils Absolute: 0 10*3/uL (ref 0.0–0.1)
Basophils Relative: 1 % (ref 0.0–3.0)
Eosinophils Absolute: 0.1 10*3/uL (ref 0.0–0.7)
Eosinophils Relative: 1.5 % (ref 0.0–5.0)
HCT: 35.1 % — ABNORMAL LOW (ref 39.0–52.0)
Hemoglobin: 11.3 g/dL — ABNORMAL LOW (ref 13.0–17.0)
Lymphocytes Relative: 29.1 % (ref 12.0–46.0)
Lymphs Abs: 1.2 10*3/uL (ref 0.7–4.0)
MCHC: 32.1 g/dL (ref 30.0–36.0)
MCV: 71.1 fl — ABNORMAL LOW (ref 78.0–100.0)
Monocytes Absolute: 0.3 10*3/uL (ref 0.1–1.0)
Monocytes Relative: 6.9 % (ref 3.0–12.0)
Neutro Abs: 2.6 10*3/uL (ref 1.4–7.7)
Neutrophils Relative %: 61.5 % (ref 43.0–77.0)
Platelets: 128 10*3/uL — ABNORMAL LOW (ref 150.0–400.0)
RBC: 4.94 Mil/uL (ref 4.22–5.81)
RDW: 18 % — ABNORMAL HIGH (ref 11.5–15.5)
WBC: 4.3 10*3/uL (ref 4.0–10.5)

## 2020-08-15 LAB — HEPATIC FUNCTION PANEL
ALT: 13 U/L (ref 0–53)
AST: 15 U/L (ref 0–37)
Albumin: 3.8 g/dL (ref 3.5–5.2)
Alkaline Phosphatase: 44 U/L (ref 39–117)
Bilirubin, Direct: 0.1 mg/dL (ref 0.0–0.3)
Total Bilirubin: 0.7 mg/dL (ref 0.2–1.2)
Total Protein: 6.5 g/dL (ref 6.0–8.3)

## 2020-08-15 LAB — PSA, MEDICARE: PSA: 2.83 ng/ml (ref 0.10–4.00)

## 2020-08-15 LAB — LIPID PANEL
Cholesterol: 165 mg/dL (ref 0–200)
HDL: 31 mg/dL — ABNORMAL LOW (ref >39.00)
NonHDL: 133.97
Total CHOL/HDL Ratio: 5
Triglycerides: 233 mg/dL — ABNORMAL HIGH (ref 0.0–149.0)
VLDL: 46.6 mg/dL — ABNORMAL HIGH (ref 0.0–40.0)

## 2020-08-15 LAB — LDL CHOLESTEROL, DIRECT: Direct LDL: 90 mg/dL

## 2020-08-15 LAB — TSH: TSH: 3.49 u[IU]/mL (ref 0.35–4.50)

## 2020-08-15 NOTE — Assessment & Plan Note (Signed)
Pt's PE WNL w/ exception of obesity.  UTD on colonoscopy.  UTD on pneumonia vaccines, COVID vaccines.  Declines flu shot.  Check labs.  Anticipatory guidance provided.

## 2020-08-15 NOTE — Assessment & Plan Note (Signed)
Ongoing issue for pt.  BMI is 34.64  Stressed need for healthy diet and regular exercise.  Check labs to risk stratify.  Will follow.

## 2020-08-15 NOTE — Patient Instructions (Signed)
Follow up in 6 months to recheck cholesterol We'll notify you of your lab results and make any changes if needed Continue to work on healthy diet and regular exercise- you can do it! Call with any questions or concerns Stay Safe!  Stay Healthy! 

## 2020-08-15 NOTE — Progress Notes (Signed)
   Subjective:    Patient ID: Jerome Scott, male    DOB: May 01, 1951, 70 y.o.   MRN: [id number]  HPI CPE- UTD on colonoscopy, Pneumonia vaccines, COVID (including booster)  Declines flu.  'I feel great'.  Reviewed past medical, surgical, family and social histories.   Patient Care Team    Relationship Specialty Notifications Start End  Midge Minium, MD PCP - General Family Medicine  07/21/17   Laurence Spates, MD (Inactive) Consulting Physician Gastroenterology  07/29/19     Health Maintenance  Topic Date Due  . Hepatitis C Screening  Never done  . INFLUENZA VACCINE  10/12/2020 (Originally 02/13/2020)  . TETANUS/TDAP  01/31/2021  . COLONOSCOPY (Pts 45-39yrs Insurance coverage will need to be confirmed)  12/29/2023  . COVID-19 Vaccine  Completed  . PNA vac Low Risk Adult  Completed      Review of Systems Patient reports no vision/hearing changes, anorexia, fever ,adenopathy, persistant/recurrent hoarseness, swallowing issues, chest pain, palpitations, edema, persistant/recurrent cough, hemoptysis, dyspnea (rest,exertional, paroxysmal nocturnal), gastrointestinal  bleeding (melena, rectal bleeding), abdominal pain, excessive heart burn, GU symptoms (dysuria, hematuria, voiding/incontinence issues) syncope, focal weakness, memory loss, numbness & tingling, skin/hair/nail changes, depression, anxiety, abnormal bruising/bleeding, musculoskeletal symptoms/signs.   This visit occurred during the SARS-CoV-2 public health emergency.  Safety protocols were in place, including screening questions prior to the visit, additional usage of staff PPE, and extensive cleaning of exam room while observing appropriate contact time as indicated for disinfecting solutions.       Objective:   Physical Exam General Appearance:    Alert, cooperative, no distress, appears stated age, obese  Head:    Normocephalic, without obvious abnormality, atraumatic  Eyes:    PERRL, conjunctiva/corneas clear, EOM's  intact, fundi    benign, both eyes       Ears:    Normal TM's and external ear canals, both ears  Nose:   Deferred due to COVID  Throat:   Neck:   Supple, symmetrical, trachea midline, no adenopathy;       thyroid:  No enlargement/tenderness/nodules  Back:     Symmetric, no curvature, ROM normal, no CVA tenderness  Lungs:     Clear to auscultation bilaterally, respirations unlabored  Chest wall:    No tenderness or deformity  Heart:    Regular rate and rhythm, S1 and S2 normal, no murmur, rub   or gallop  Abdomen:     Soft, non-tender, bowel sounds active all four quadrants,    no masses, no organomegaly  Genitalia:    Deferred   Rectal:    Extremities:   Extremities normal, atraumatic, no cyanosis or edema  Pulses:   2+ and symmetric all extremities  Skin:   Skin color, texture, turgor normal, no rashes or lesions  Lymph nodes:   Cervical, supraclavicular, and axillary nodes normal  Neurologic:   CNII-XII intact. Normal strength, sensation and reflexes      throughout          Assessment & Plan:

## 2020-08-16 LAB — HEPATITIS C ANTIBODY
Hepatitis C Ab: NONREACTIVE
SIGNAL TO CUT-OFF: 0.04 (ref <1.00)

## 2020-08-17 NOTE — Progress Notes (Signed)
Advised pt of lab results. Pt has no further question.

## 2020-08-24 ENCOUNTER — Other Ambulatory Visit: Payer: Self-pay | Admitting: Family Medicine

## 2020-10-02 DIAGNOSIS — H401222 Low-tension glaucoma, left eye, moderate stage: Secondary | ICD-10-CM | POA: Diagnosis not present

## 2020-10-02 DIAGNOSIS — H401213 Low-tension glaucoma, right eye, severe stage: Secondary | ICD-10-CM | POA: Diagnosis not present

## 2020-10-25 ENCOUNTER — Other Ambulatory Visit: Payer: Self-pay | Admitting: Family Medicine

## 2021-01-10 ENCOUNTER — Encounter: Payer: Self-pay | Admitting: *Deleted

## 2021-02-12 ENCOUNTER — Encounter: Payer: Self-pay | Admitting: Registered Nurse

## 2021-02-12 ENCOUNTER — Ambulatory Visit (INDEPENDENT_AMBULATORY_CARE_PROVIDER_SITE_OTHER): Payer: Medicare HMO | Admitting: Registered Nurse

## 2021-02-12 ENCOUNTER — Other Ambulatory Visit: Payer: Self-pay

## 2021-02-12 ENCOUNTER — Ambulatory Visit: Payer: Medicare HMO | Admitting: Family Medicine

## 2021-02-12 VITALS — BP 129/72 | HR 63 | Temp 98.2°F | Resp 18 | Ht 72.0 in | Wt 245.0 lb

## 2021-02-12 DIAGNOSIS — E669 Obesity, unspecified: Secondary | ICD-10-CM

## 2021-02-12 DIAGNOSIS — E785 Hyperlipidemia, unspecified: Secondary | ICD-10-CM | POA: Diagnosis not present

## 2021-02-12 LAB — LIPID PANEL
Cholesterol: 137 mg/dL (ref 0–200)
HDL: 32.1 mg/dL — ABNORMAL LOW (ref >39.00)
LDL Cholesterol: 91 mg/dL (ref 0–99)
NonHDL: 104.69
Total CHOL/HDL Ratio: 4
Triglycerides: 67 mg/dL (ref 0.0–149.0)
VLDL: 13.4 mg/dL (ref 0.0–40.0)

## 2021-02-12 NOTE — Progress Notes (Signed)
Established Patient Office Visit  Subjective:  Patient ID: Jerome Scott, male    DOB: 1951-02-05  Age: 70 y.o. MRN: [id number]  CC:  Chief Complaint  Patient presents with   Follow-up    Patient states he is here for a 6 month follow up. Patient wants to re check Lipids.    HPI Wah Dildine presents for 6 mo follow up  Hyperlipidemia - Has worked on diet and exercise Taking atorvastatin '10mg'$  PO qd No AE No urgent concerns  Obesity -  Continues lifestyle modifications Down 10lb since CPE in Feb   Past Medical History:  Diagnosis Date   Blood in stool    GERD (gastroesophageal reflux disease)     History reviewed. No pertinent surgical history.  Family History  Problem Relation Age of Onset   Hyperlipidemia Mother    Hypertension Mother    Heart disease Father    Cancer Father        brain tumor   Diabetes Father    Heart attack Father    Diabetes Sister    Heart attack Sister    Hyperlipidemia Sister    Hypertension Sister    Diabetes Brother    Alcohol abuse Daughter    Heart attack Maternal Grandmother    Heart attack Maternal Grandfather    Alcohol abuse Paternal Grandfather    Heart attack Brother    Hyperlipidemia Brother    Hypertension Brother    Heart attack Brother    Hyperlipidemia Brother    Hypertension Brother     Social History   Socioeconomic History   Marital status: Married    Spouse name: Not on file   Number of children: Not on file   Years of education: Not on file   Highest education level: Not on file  Occupational History   Not on file  Tobacco Use   Smoking status: Never   Smokeless tobacco: Never  Vaping Use   Vaping Use: Never used  Substance and Sexual Activity   Alcohol use: No   Drug use: No   Sexual activity: Yes  Other Topics Concern   Not on file  Social History Narrative   Not on file   Social Determinants of Health   Financial Resource Strain: Low Risk    Difficulty of Paying Living Expenses:  Not hard at all  Food Insecurity: No Food Insecurity   Worried About Charity fundraiser in the Last Year: Never true   [place] in the Last Year: Never true  Transportation Needs: No Transportation Needs   Lack of Transportation (Medical): No   Lack of Transportation (Non-Medical): No  Physical Activity: Insufficiently Active   Days of Exercise per Week: 3 days   Minutes of Exercise per Session: 40 min  Stress: No Stress Concern Present   Feeling of Stress : Not at all  Social Connections: Socially Integrated   Frequency of Communication with Friends and Family: More than three times a week   Frequency of Social Gatherings with Friends and Family: More than three times a week   Attends Religious Services: More than 4 times per year   Active Member of [employer] or Organizations: Yes   Attends Archivist Meetings: 1 to 4 times per year   Marital Status: Married  Human resources officer Violence: Not At Risk   Fear of Current or Ex-Partner: No   Emotionally Abused: No   Physically Abused: No   Sexually Abused: No  Outpatient Medications Prior to Visit  Medication Sig Dispense Refill   atorvastatin (LIPITOR) 10 MG tablet TAKE 1 TABLET BY MOUTH EVERY DAY 90 tablet 1   dorzolamide-timolol (COSOPT) 22.3-6.8 MG/ML ophthalmic solution INSTILL 1 DROP INTO BOTH EYES TWICE A DAY     latanoprost (XALATAN) 0.005 % ophthalmic solution INSTILL 1 DROP INTO BOTH EYES EVERY DAY IN THE EVENING     levothyroxine (SYNTHROID) 50 MCG tablet TAKE 1 TABLET BY MOUTH EVERY DAY 90 tablet 2   omeprazole (PRILOSEC) 20 MG capsule      No facility-administered medications prior to visit.    No Known Allergies  ROS Review of Systems  Constitutional: Negative.   HENT: Negative.    Eyes: Negative.   Respiratory: Negative.    Cardiovascular: Negative.   Gastrointestinal: Negative.   Genitourinary: Negative.   Musculoskeletal: Negative.   Skin: Negative.   Neurological: Negative.    Psychiatric/Behavioral: Negative.    All other systems reviewed and are negative.    Objective:    Physical Exam Constitutional:      General: He is not in acute distress.    Appearance: Normal appearance. He is normal weight. He is not ill-appearing, toxic-appearing or diaphoretic.  Cardiovascular:     Rate and Rhythm: Normal rate and regular rhythm.     Heart sounds: Normal heart sounds. No murmur heard.   No friction rub. No gallop.  Pulmonary:     Effort: Pulmonary effort is normal. No respiratory distress.     Breath sounds: Normal breath sounds. No stridor. No wheezing, rhonchi or rales.  Chest:     Chest wall: No tenderness.  Neurological:     General: No focal deficit present.     Mental Status: He is alert and oriented to person, place, and time. Mental status is at baseline.  Psychiatric:        Mood and Affect: Mood normal.        Behavior: Behavior normal.        Thought Content: Thought content normal.        Judgment: Judgment normal.    BP 129/72   Pulse 63   Temp 98.2 F (36.8 C) (Temporal)   Resp 18   Ht 6' (1.829 m)   Wt 245 lb (111.1 kg)   SpO2 99%   BMI 33.23 kg/m  Wt Readings from Last 3 Encounters:  02/12/21 245 lb (111.1 kg)  08/15/20 255 lb 6.4 oz (115.8 kg)  08/14/20 252 lb (114.3 kg)     Health Maintenance Due  Topic Date Due   INFLUENZA VACCINE  02/12/2021    There are no preventive care reminders to display for this patient.  Lab Results  Component Value Date   TSH 3.49 08/15/2020   Lab Results  Component Value Date   WBC 4.3 08/15/2020   HGB 11.3 (L) 08/15/2020   HCT 35.1 (L) 08/15/2020   MCV 71.1 (L) 08/15/2020   PLT 128.0 (L) 08/15/2020   Lab Results  Component Value Date   NA 136 08/15/2020   K 4.2 08/15/2020   CO2 27 08/15/2020   GLUCOSE 84 08/15/2020   BUN 11 08/15/2020   CREATININE 0.99 08/15/2020   BILITOT 0.7 08/15/2020   ALKPHOS 44 08/15/2020   AST 15 08/15/2020   ALT 13 08/15/2020   PROT 6.5  08/15/2020   ALBUMIN 3.8 08/15/2020   CALCIUM 9.1 08/15/2020   GFR 78.98 08/15/2020   Lab Results  Component Value Date   CHOL 165  08/15/2020   Lab Results  Component Value Date   HDL 31.00 (L) 08/15/2020   Lab Results  Component Value Date   LDLCALC 114 (H) 07/29/2019   Lab Results  Component Value Date   TRIG 233.0 (H) 08/15/2020   Lab Results  Component Value Date   CHOLHDL 5 08/15/2020   No results found for: HGBA1C    Assessment & Plan:   Problem List Items Addressed This Visit       Other   Obesity (BMI 30-39.9)   Hyperlipidemia - Primary   Relevant Orders   Lipid panel    No orders of the defined types were placed in this encounter.   Follow-up: Return in about 6 months (around 08/15/2021) for CPE and labs w PCP.   PLAN Labs collected. Will follow up with the patient as warranted. Keep up great work with lifestyle modifications Return in 6 mo w PCP for CPE and labs Patient encouraged to call clinic with any questions, comments, or concerns.  Maximiano Coss, NP

## 2021-02-12 NOTE — Patient Instructions (Addendum)
Jerome Scott -  Not much else to say besides "great work"!  Let's see how well the labs reflect your effort. Results in tomorrow. I'll call with urgent concerns, otherwise see MyChart  See you in 6 mo for physical and labs with Dr. Birdie Riddle  Thank you  Jerome Scott     If you have lab work done today you will be contacted with your lab results within the next 2 weeks.  If you have not heard from [place] then please contact us. The fastest way to get your results is to register for My Chart.   IF you received an x-ray today, you will receive an invoice from [facility]. Please contact [facility] at [phone] with questions or concerns regarding your invoice.   IF you received labwork today, you will receive an invoice from [facility]. Please contact [facility] at [phone] with questions or concerns regarding your invoice.   Our billing staff will not be able to assist you with questions regarding bills from these companies.  You will be contacted with the lab results as soon as they are available. The fastest way to get your results is to activate your My Chart account. Instructions are located on the last page of this paperwork. If you have not heard from [place] regarding the results in 2 weeks, please contact this office.

## 2021-03-08 ENCOUNTER — Other Ambulatory Visit: Payer: Self-pay | Admitting: Family Medicine

## 2021-04-07 ENCOUNTER — Telehealth: Payer: Medicare HMO | Admitting: Nurse Practitioner

## 2021-04-07 VITALS — HR 61

## 2021-04-07 DIAGNOSIS — U071 COVID-19: Secondary | ICD-10-CM | POA: Diagnosis not present

## 2021-04-07 MED ORDER — MOLNUPIRAVIR EUA 200MG CAPSULE
4.0000 | ORAL_CAPSULE | Freq: Two times a day (BID) | ORAL | 0 refills | Status: AC
Start: 2021-04-07 — End: 2021-04-12

## 2021-04-07 NOTE — Progress Notes (Signed)
Virtual Visit Consent   Jerome Scott, you are scheduled for a virtual visit with a [facility] provider today.     Just as with appointments in the office, your consent must be obtained to participate.  Your consent will be active for this visit and any virtual visit you may have with one of our providers in the next 365 days.     If you have a MyChart account, a copy of this consent can be sent to you electronically.  All virtual visits are billed to your insurance company just like a traditional visit in the office.    As this is a virtual visit, video technology does not allow for your provider to perform a traditional examination.  This may limit your provider's ability to fully assess your condition.  If your provider identifies any concerns that need to be evaluated in person or the need to arrange testing (such as labs, EKG, etc.), we will make arrangements to do so.     Although advances in technology are sophisticated, we cannot ensure that it will always work on either your end or our end.  If the connection with a video visit is poor, the visit may have to be switched to a telephone visit.  With either a video or telephone visit, we are not always able to ensure that we have a secure connection.     I need to obtain your verbal consent now.   Are you willing to proceed with your visit today?    Jerome Scott has provided verbal consent on 04/07/2021 for a virtual visit (video or telephone).   Jerome Schneiders, FNP   Date: 04/07/2021 6:30 PM   Virtual Visit via Video Note   I, Jerome Scott, connected with  Jerome Scott  ([id number], 10/25/1950) on 04/07/21 at  6:45 PM EDT by a video-enabled telemedicine application and verified that I am speaking with the correct person using two identifiers.  Location: Patient: Virtual Visit Location Patient: Home Provider: Virtual Visit Location Provider: Office/Clinic   I discussed the limitations of evaluation and management by telemedicine and  the availability of in person appointments. The patient expressed understanding and agreed to proceed.    History of Present Illness: Jerome Scott is a 70 y.o. who identifies as a male who was assigned male at birth, and is being seen today after testing positive for COVID-19. He started to have symptoms 4 days ago.  He has had a sore throat and a slight fever. He has been monitoring his oxygen and pulse at home. SpO2 96% pulse 61  Denies a history of COVID infection Has been vaccinated x2 for COVID   Denies a history of asthma, bronchitis or pneumonia.   Problems:  Patient Active Problem List   Diagnosis Date Noted   Hyperlipidemia 01/20/2018   Obesity (BMI 30-39.9) 07/21/2017   Routine general medical examination at a health care facility 01/31/2014    Allergies: No Known Allergies Medications:  Current Outpatient Medications:    atorvastatin (LIPITOR) 10 MG tablet, TAKE 1 TABLET BY MOUTH EVERY DAY, Disp: 90 tablet, Rfl: 1   dorzolamide-timolol (COSOPT) 22.3-6.8 MG/ML ophthalmic solution, INSTILL 1 DROP INTO BOTH EYES TWICE A DAY, Disp: , Rfl:    latanoprost (XALATAN) 0.005 % ophthalmic solution, INSTILL 1 DROP INTO BOTH EYES EVERY DAY IN THE EVENING, Disp: , Rfl:    levothyroxine (SYNTHROID) 50 MCG tablet, TAKE 1 TABLET BY MOUTH EVERY DAY, Disp: 90 tablet, Rfl: 2   omeprazole (PRILOSEC) 20  MG capsule, , Disp: , Rfl:   Observations/Objective: Patient is well-developed, well-nourished in no acute distress.  Resting comfortably at home.  Head is normocephalic, atraumatic.  No labored breathing.  Speech is clear and coherent with logical content.  Patient is alert and oriented at baseline.    Assessment and Plan: 1. COVID-19  - molnupiravir EUA (LAGEVRIO) 200 mg CAPS capsule; Take 4 capsules (800 mg total) by mouth 2 (two) times daily for 5 days.  Dispense: 40 capsule; Refill: 0    Discussed continuing to manage symptoms with over the counter medications.  Assure hydration  and proper caloric intake to assure recovery  Seek medical attention for any acutely worsening symptoms as discussed  Follow Up Instructions: I discussed the assessment and treatment plan with the patient. The patient was provided an opportunity to ask questions and all were answered. The patient agreed with the plan and demonstrated an understanding of the instructions.  A copy of instructions were sent to the patient via MyChart unless otherwise noted below.     The patient was advised to call back or seek an in-person evaluation if the symptoms worsen or if the condition fails to improve as anticipated.  Time:  I spent 15 minutes with the patient via telehealth technology discussing the above problems/concerns.    Jerome Schneiders, FNP

## 2021-04-07 NOTE — Patient Instructions (Signed)
You are being prescribed MOLNUPIRAVIR for COVID-19 infection.  ° ° °Please call the pharmacy or go through the drive through vs going inside if you are picking up the mediation yourself to prevent further spread. If prescribed to a [facility] affiliated pharmacy, a pharmacist will bring the medication out to your car. ° ° °ADMINISTRATION INSTRUCTIONS: °Take with or without food. Swallow the tablets whole. Don't chew, crush, or break the medications because it might not work as well ° °For each dose of the medication, you should be taking FOUR tablets at one time, TWICE a day  ° °Finish your full five-day course of Molnupiravir even if you feel better before you're done. Stopping this medication too early can make it less effective to prevent severe illness related to COVID19.   ° °Molnupiravir is prescribed for YOU ONLY. Don't share it with others, even if they have similar symptoms as you. This medication might not be right for everyone.  ° °Make sure to take steps to protect yourself and others while you're taking this medication in order to get well soon and to prevent others from getting sick with COVID-19. ° ° °**If you are of childbearing potential (any gender) - it is advised to not get pregnant while taking this medication and recommended that condoms are used for male partners the next 3 months after taking the medication out of extreme caution  ° ° °COMMON SIDE EFFECTS: °Diarrhea °Nausea  °Dizziness ° ° ° °If your COVID-19 symptoms get worse, get medical help right away. Call 911 if you experience symptoms such as worsening cough, trouble breathing, chest pain that doesn't go away, confusion, a hard time staying awake, and pale or blue-colored skin. °This medication won't prevent all COVID-19 cases from getting worse.  ° ° °

## 2021-05-11 DIAGNOSIS — H401222 Low-tension glaucoma, left eye, moderate stage: Secondary | ICD-10-CM | POA: Diagnosis not present

## 2021-05-11 DIAGNOSIS — H401213 Low-tension glaucoma, right eye, severe stage: Secondary | ICD-10-CM | POA: Diagnosis not present

## 2021-05-11 DIAGNOSIS — H524 Presbyopia: Secondary | ICD-10-CM | POA: Diagnosis not present

## 2021-05-31 ENCOUNTER — Encounter: Payer: Self-pay | Admitting: Family Medicine

## 2021-05-31 MED ORDER — OMEPRAZOLE 20 MG PO CPDR: 20.0000 mg | DELAYED_RELEASE_CAPSULE | Freq: Every day | ORAL | 1 refills | Status: DC

## 2021-07-18 ENCOUNTER — Other Ambulatory Visit: Payer: Self-pay | Admitting: Family Medicine

## 2021-07-31 DIAGNOSIS — R69 Illness, unspecified: Secondary | ICD-10-CM | POA: Diagnosis not present

## 2021-08-15 ENCOUNTER — Ambulatory Visit (INDEPENDENT_AMBULATORY_CARE_PROVIDER_SITE_OTHER): Payer: Medicare HMO | Admitting: Family Medicine

## 2021-08-15 ENCOUNTER — Encounter: Payer: Self-pay | Admitting: Family Medicine

## 2021-08-15 VITALS — BP 124/70 | HR 75 | Temp 98.4°F | Resp 16 | Ht 72.0 in | Wt 240.2 lb

## 2021-08-15 DIAGNOSIS — Z125 Encounter for screening for malignant neoplasm of prostate: Secondary | ICD-10-CM | POA: Diagnosis not present

## 2021-08-15 DIAGNOSIS — E669 Obesity, unspecified: Secondary | ICD-10-CM

## 2021-08-15 DIAGNOSIS — Z0000 Encounter for general adult medical examination without abnormal findings: Secondary | ICD-10-CM | POA: Diagnosis not present

## 2021-08-15 LAB — BASIC METABOLIC PANEL
BUN: 14 mg/dL (ref 6–23)
CO2: 27 mEq/L (ref 19–32)
Calcium: 9.2 mg/dL (ref 8.4–10.5)
Chloride: 103 mEq/L (ref 96–112)
Creatinine, Ser: 0.99 mg/dL (ref 0.40–1.50)
GFR: 78.43 mL/min (ref >60.00)
Glucose, Bld: 97 mg/dL (ref 70–99)
Potassium: 4.2 mEq/L (ref 3.5–5.1)
Sodium: 136 mEq/L (ref 135–145)

## 2021-08-15 LAB — CBC WITH DIFFERENTIAL/PLATELET
Basophils Absolute: 0 10*3/uL (ref 0.0–0.1)
Basophils Relative: 1 % (ref 0.0–3.0)
Eosinophils Absolute: 0 10*3/uL (ref 0.0–0.7)
Eosinophils Relative: 1 % (ref 0.0–5.0)
HCT: 36.4 % — ABNORMAL LOW (ref 39.0–52.0)
Hemoglobin: 11.5 g/dL — ABNORMAL LOW (ref 13.0–17.0)
Lymphocytes Relative: 31.3 % (ref 12.0–46.0)
Lymphs Abs: 1.1 10*3/uL (ref 0.7–4.0)
MCHC: 31.5 g/dL (ref 30.0–36.0)
MCV: 73.5 fl — ABNORMAL LOW (ref 78.0–100.0)
Monocytes Absolute: 0.3 10*3/uL (ref 0.1–1.0)
Monocytes Relative: 8.2 % (ref 3.0–12.0)
Neutro Abs: 2 10*3/uL (ref 1.4–7.7)
Neutrophils Relative %: 58.5 % (ref 43.0–77.0)
Platelets: 131 10*3/uL — ABNORMAL LOW (ref 150.0–400.0)
RBC: 4.95 Mil/uL (ref 4.22–5.81)
RDW: 16.9 % — ABNORMAL HIGH (ref 11.5–15.5)
WBC: 3.4 10*3/uL — ABNORMAL LOW (ref 4.0–10.5)

## 2021-08-15 LAB — HEPATIC FUNCTION PANEL
ALT: 13 U/L (ref 0–53)
AST: 17 U/L (ref 0–37)
Albumin: 4.2 g/dL (ref 3.5–5.2)
Alkaline Phosphatase: 42 U/L (ref 39–117)
Bilirubin, Direct: 0.2 mg/dL (ref 0.0–0.3)
Total Bilirubin: 1 mg/dL (ref 0.2–1.2)
Total Protein: 6.8 g/dL (ref 6.0–8.3)

## 2021-08-15 LAB — LIPID PANEL
Cholesterol: 154 mg/dL (ref 0–200)
HDL: 34.3 mg/dL — ABNORMAL LOW (ref >39.00)
LDL Cholesterol: 101 mg/dL — ABNORMAL HIGH (ref 0–99)
NonHDL: 119.52
Total CHOL/HDL Ratio: 4
Triglycerides: 93 mg/dL (ref 0.0–149.0)
VLDL: 18.6 mg/dL (ref 0.0–40.0)

## 2021-08-15 LAB — PSA, MEDICARE: PSA: 2.85 ng/ml (ref 0.10–4.00)

## 2021-08-15 LAB — TSH: TSH: 4.4 u[IU]/mL (ref 0.35–5.50)

## 2021-08-15 MED ORDER — OMEPRAZOLE 20 MG PO CPDR: 20.0000 mg | DELAYED_RELEASE_CAPSULE | Freq: Every day | ORAL | 1 refills | Status: DC

## 2021-08-15 NOTE — Patient Instructions (Addendum)
Follow up in 6 months to recheck cholesterol We'll notify you of your lab results and make any changes if needed Keep up the good work on healthy diet and regular exercise- you're doing great!!! Get your tetanus shot (Tdap) at the pharmacy at your convenience Call with any questions or concerns Stay Safe!  Stay Healthy! Happy New Year!!

## 2021-08-15 NOTE — Progress Notes (Signed)
° °  Subjective:    Patient ID: Jerome Scott, male    DOB: 20-Apr-1951, 71 y.o.   MRN: [id number]  HPI CPE- UTD on colonoscopy, PNA.  Due for Tdap but will need to get at pharmacy  Patient Care Team    Relationship Specialty Notifications Start End  Midge Minium, MD PCP - General Family Medicine  07/21/17   Laurence Spates, MD (Inactive) Consulting Physician Gastroenterology  07/29/19     Health Maintenance  Topic Date Due   Zoster Vaccines- Shingrix (1 of 2) Never done   COVID-19 Vaccine (4 - Booster for Moderna series) 08/09/2020   INFLUENZA VACCINE  10/12/2021 (Originally 02/12/2021)   TETANUS/TDAP  02/12/2022 (Originally 01/31/2021)   COLONOSCOPY (Pts 45-33yrs Insurance coverage will need to be confirmed)  12/29/2023   Pneumonia Vaccine 80+ Years old  Completed   Hepatitis C Screening  Completed   HPV VACCINES  Aged Out      Review of Systems Patient reports no vision/hearing changes, anorexia, fever ,adenopathy, persistant/recurrent hoarseness, swallowing issues, chest pain, palpitations, edema, persistant/recurrent cough, hemoptysis, dyspnea (rest,exertional, paroxysmal nocturnal), gastrointestinal  bleeding (melena, rectal bleeding), abdominal pain, excessive heart burn, GU symptoms (dysuria, hematuria, voiding/incontinence issues) syncope, focal weakness, memory loss, numbness & tingling, skin/hair/nail changes, depression, anxiety, abnormal bruising/bleeding, musculoskeletal symptoms/signs.   + 15 lb weight loss  This visit occurred during the SARS-CoV-2 public health emergency.  Safety protocols were in place, including screening questions prior to the visit, additional usage of staff PPE, and extensive cleaning of exam room while observing appropriate contact time as indicated for disinfecting solutions.      Objective:   Physical Exam General Appearance:    Alert, cooperative, no distress, appears stated age, obese  Head:    Normocephalic, without obvious abnormality,  atraumatic  Eyes:    PERRL, conjunctiva/corneas clear, EOM's intact, fundi    benign, both eyes       Ears:    Normal TM's and external ear canals, both ears  Nose:   Deferred due to COVID  Throat:   Neck:   Supple, symmetrical, trachea midline, no adenopathy;       thyroid:  No enlargement/tenderness/nodules  Back:     Symmetric, no curvature, ROM normal, no CVA tenderness  Lungs:     Clear to auscultation bilaterally, respirations unlabored  Chest wall:    No tenderness or deformity  Heart:    Regular rate and rhythm, S1 and S2 normal, no murmur, rub   or gallop  Abdomen:     Soft, non-tender, bowel sounds active all four quadrants,    no masses, no organomegaly.  + umbilical hernia  Genitalia:    deferred  Rectal:    Extremities:   Extremities normal, atraumatic, no cyanosis or edema  Pulses:   2+ and symmetric all extremities  Skin:   Skin color, texture, turgor normal, no rashes or lesions  Lymph nodes:   Cervical, supraclavicular, and axillary nodes normal  Neurologic:   CNII-XII intact. Normal strength, sensation and reflexes      throughout          Assessment & Plan:

## 2021-08-15 NOTE — Assessment & Plan Note (Signed)
Pt is down 15 lbs since last visit.  Applauded his efforts at healthy diet and regular exercise and encouraged him to continue.  Will follow.

## 2021-08-15 NOTE — Assessment & Plan Note (Signed)
Pt's PE WNL w/ exception of obesity and umbilical hernia.  UTD on colonoscopy, PNA.  Pt will get Tdap at pharmacy.  Check labs.  Anticipatory guidance provided.

## 2021-08-16 ENCOUNTER — Encounter: Payer: Self-pay | Admitting: Family Medicine

## 2021-08-20 ENCOUNTER — Ambulatory Visit: Payer: Medicare HMO

## 2021-08-23 ENCOUNTER — Ambulatory Visit (INDEPENDENT_AMBULATORY_CARE_PROVIDER_SITE_OTHER): Payer: Medicare HMO

## 2021-08-23 VITALS — Ht 72.0 in | Wt 235.0 lb

## 2021-08-23 DIAGNOSIS — Z0000 Encounter for general adult medical examination without abnormal findings: Secondary | ICD-10-CM | POA: Diagnosis not present

## 2021-08-23 NOTE — Progress Notes (Signed)
I connected with [name] today by telephone and verified that I am speaking with the correct person using two identifiers. Location patient: home Location provider: work Persons participating in the virtual visit: [name], Glenna Durand LPN.   I discussed the limitations, risks, security and privacy concerns of performing an evaluation and management service by telephone and the availability of in person appointments. I also discussed with the patient that there may be a patient responsible charge related to this service. The patient expressed understanding and verbally consented to this telephonic visit.    Interactive audio and video telecommunications were attempted between this provider and patient, however failed, due to patient having technical difficulties OR patient did not have access to video capability.  We continued and completed visit with audio only.     Vital signs may be patient reported or missing.  Subjective:   Jerome Scott is a 71 y.o. male who presents for Medicare Annual/Subsequent preventive examination.  Review of Systems     Cardiac Risk Factors include: advanced age (>38men, >47 women);male gender;obesity (BMI >30kg/m2)     Objective:    Today's Vitals   08/23/21 0857  Weight: 235 lb (106.6 kg)  Height: 6' (1.829 m)   Body mass index is 31.87 kg/m.  Advanced Directives 08/23/2021 08/14/2020 07/23/2018  Does Patient Have a Medical Advance Directive? Yes No;Yes Yes  Type of Paramedic of [place];Living will [place];Living will [place];Living will  Does patient want to make changes to medical advance directive? - - No - Patient declined  Copy of [place] in Chart? No - copy requested No - copy requested No - copy requested    Current Medications (verified) Outpatient Encounter Medications as of 08/23/2021  Medication Sig   atorvastatin (LIPITOR) 10 MG tablet  TAKE 1 TABLET BY MOUTH EVERY DAY   dorzolamide-timolol (COSOPT) 22.3-6.8 MG/ML ophthalmic solution INSTILL 1 DROP INTO BOTH EYES TWICE A DAY   latanoprost (XALATAN) 0.005 % ophthalmic solution INSTILL 1 DROP INTO BOTH EYES EVERY DAY IN THE EVENING   levothyroxine (SYNTHROID) 50 MCG tablet TAKE 1 TABLET BY MOUTH EVERY DAY   omeprazole (PRILOSEC) 20 MG capsule Take 1 capsule (20 mg total) by mouth daily.   No facility-administered encounter medications on file as of 08/23/2021.    Allergies (verified) Patient has no known allergies.   History: Past Medical History:  Diagnosis Date   Blood in stool    GERD (gastroesophageal reflux disease)    History reviewed. No pertinent surgical history. Family History  Problem Relation Age of Onset   Hyperlipidemia Mother    Hypertension Mother    Heart disease Father    Cancer Father        brain tumor   Diabetes Father    Heart attack Father    Diabetes Sister    Heart attack Sister    Hyperlipidemia Sister    Hypertension Sister    Diabetes Brother    Alcohol abuse Daughter    Heart attack Maternal Grandmother    Heart attack Maternal Grandfather    Alcohol abuse Paternal Grandfather    Heart attack Brother    Hyperlipidemia Brother    Hypertension Brother    Heart attack Brother    Hyperlipidemia Brother    Hypertension Brother    Social History   Socioeconomic History   Marital status: Married    Spouse name: Not on file   Number of children: Not  on file   Years of education: Not on file   Highest education level: Not on file  Occupational History   Not on file  Tobacco Use   Smoking status: Never    Passive exposure: Past   Smokeless tobacco: Never  Vaping Use   Vaping Use: Never used  Substance and Sexual Activity   Alcohol use: No   Drug use: No   Sexual activity: Yes  Other Topics Concern   Not on file  Social History Narrative   Not on file   Social Determinants of Health   Financial Resource Strain: Low  Risk    Difficulty of Paying Living Expenses: Not hard at all  Food Insecurity: No Food Insecurity   Worried About Charity fundraiser in the Last Year: Never true   [place] in the Last Year: Never true  Transportation Needs: No Transportation Needs   Lack of Transportation (Medical): No   Lack of Transportation (Non-Medical): No  Physical Activity: Sufficiently Active   Days of Exercise per Week: 3 days   Minutes of Exercise per Session: 50 min  Stress: No Stress Concern Present   Feeling of Stress : Not at all  Social Connections: Not on file    Tobacco Counseling Counseling given: Not Answered   Clinical Intake:  Pre-visit preparation completed: Yes  Pain : No/denies pain     Nutritional Status: BMI > 30  Obese Nutritional Risks: None Diabetes: No  How often do you need to have someone help you when you read instructions, pamphlets, or other written materials from your doctor or pharmacy?: 1 - Never What is the last grade level you completed in school?: 12th grade  Diabetic? no  Interpreter Needed?: No  Information entered by :: NAllen LPN   Activities of Daily Living In your present state of health, do you have any difficulty performing the following activities: 08/23/2021 08/15/2021  Hearing? N N  Vision? N N  Difficulty concentrating or making decisions? N N  Walking or climbing stairs? N N  Dressing or bathing? N N  Doing errands, shopping? N N  Preparing Food and eating ? N -  Using the Toilet? N -  In the past six months, have you accidently leaked urine? N -  Do you have problems with loss of bowel control? N -  Managing your Medications? N -  Managing your Finances? N -  Housekeeping or managing your Housekeeping? N -  Some recent data might be hidden    Patient Care Team: Midge Minium, MD as PCP - General (Family Medicine) Laurence Spates, MD (Inactive) as Consulting Physician (Gastroenterology)  Indicate any recent Medical  Services you may have received from other than [facility] providers in the past year (date may be approximate).     Assessment:   This is a routine wellness examination for [name].  Hearing/Vision screen Vision Screening - Comments:: Regular eye exams, [facility]  Dietary issues and exercise activities discussed: Current Exercise Habits: Home exercise routine, Type of exercise: strength training/weights, Time (Minutes): 50, Frequency (Times/Week): 3, Weekly Exercise (Minutes/Week): 150   Goals Addressed             This Visit's Progress    Patient Stated       08/23/2021, wants to lose 20 pounds       Depression Screen PHQ 2/9 Scores 08/23/2021 08/15/2021 02/12/2021 08/15/2020 08/14/2020 07/29/2019 01/28/2019  PHQ - 2 Score 0 0 0 0 0 0  0  PHQ- 9 Score - 0 - 0 - 0 0    Fall Risk Fall Risk  08/23/2021 08/15/2021 02/12/2021 08/15/2020 08/14/2020  Falls in the past year? 0 0 0 0 0  Number falls in past yr: - - 0 0 0  Injury with Fall? - - 0 0 0  Risk for fall due to : No Fall Risks No Fall Risks - No Fall Risks -  Follow up Falls evaluation completed;Education provided;Falls prevention discussed Falls evaluation completed Falls evaluation completed - Falls prevention discussed    FALL RISK PREVENTION PERTAINING TO THE HOME:  Any stairs in or around the home? No  If so, are there any without handrails?  N/a Home free of loose throw rugs in walkways, pet beds, electrical cords, etc? Yes  Adequate lighting in your home to reduce risk of falls? Yes   ASSISTIVE DEVICES UTILIZED TO PREVENT FALLS:  Life alert? No  Use of a cane, walker or w/c? No  Grab bars in the bathroom? Yes  Shower chair or bench in shower? Yes  Elevated toilet seat or a handicapped toilet? Yes   TIMED UP AND GO:  Was the test performed? No .      Cognitive Function:     6CIT Screen 08/23/2021  What Year? 0 points  What month? 0 points  What time? 0 points  Count back from 20 0 points  Months in reverse 0  points  Repeat phrase 4 points  Total Score 4    Immunizations Immunization History  Administered Date(s) Administered   Moderna Covid-19 Vaccine Bivalent Booster 87yrs & up 06/16/2021   Moderna Sars-Covid-2 Vaccination 08/13/2019, 09/10/2019, 06/14/2020   Pneumococcal Conjugate-13 07/23/2018   Pneumococcal Polysaccharide-23 07/29/2019   Tdap 02/01/2011    TDAP status: Due, Education has been provided regarding the importance of this vaccine. Advised may receive this vaccine at local pharmacy or Health Dept. Aware to provide a copy of the vaccination record if obtained from local pharmacy or Health Dept. Verbalized acceptance and understanding.  Flu Vaccine status: Declined, Education has been provided regarding the importance of this vaccine but patient still declined. Advised may receive this vaccine at local pharmacy or Health Dept. Aware to provide a copy of the vaccination record if obtained from local pharmacy or Health Dept. Verbalized acceptance and understanding.  Pneumococcal vaccine status: Up to date  Covid-19 vaccine status: Completed vaccines  Qualifies for Shingles Vaccine? Yes   Zostavax completed No   Shingrix Completed?: No.    Education has been provided regarding the importance of this vaccine. Patient has been advised to call insurance company to determine out of pocket expense if they have not yet received this vaccine. Advised may also receive vaccine at local pharmacy or Health Dept. Verbalized acceptance and understanding.  Screening Tests Health Maintenance  Topic Date Due   Zoster Vaccines- Shingrix (1 of 2) Never done   INFLUENZA VACCINE  10/12/2021 (Originally 02/12/2021)   TETANUS/TDAP  02/12/2022 (Originally 01/31/2021)   COLONOSCOPY (Pts 45-77yrs Insurance coverage will need to be confirmed)  12/29/2023   Pneumonia Vaccine 108+ Years old  Completed   COVID-19 Vaccine  Completed   Hepatitis C Screening  Completed   HPV VACCINES  Aged Out    Health  Maintenance  Health Maintenance Due  Topic Date Due   Zoster Vaccines- Shingrix (1 of 2) Never done    Colorectal cancer screening: Type of screening: Colonoscopy. Completed 12/29/2018. Repeat every 5 years  Lung Cancer Screening: (  Low Dose CT Chest recommended if Age 32-80 years, 30 pack-year currently smoking OR have quit w/in 15years.) does not qualify.   Lung Cancer Screening Referral: no  Additional Screening:  Hepatitis C Screening: does qualify; Completed 08/15/2020  Vision Screening: Recommended annual ophthalmology exams for early detection of glaucoma and other disorders of the eye. Is the patient up to date with their annual eye exam?  Yes  Who is the provider or what is the name of the office in which the patient attends annual eye exams? [facility] If pt is not established with a provider, would they like to be referred to a provider to establish care? No .   Dental Screening: Recommended annual dental exams for proper oral hygiene  Community Resource Referral / Chronic Care Management: CRR required this visit?  No   CCM required this visit?  No      Plan:     I have personally reviewed and noted the following in the patients chart:   Medical and social history Use of alcohol, tobacco or illicit drugs  Current medications and supplements including opioid prescriptions. Patient is not currently taking opioid prescriptions. Functional ability and status Nutritional status Physical activity Advanced directives List of other physicians Hospitalizations, surgeries, and ER visits in previous 12 months Vitals Screenings to include cognitive, depression, and falls Referrals and appointments  In addition, I have reviewed and discussed with patient certain preventive protocols, quality metrics, and best practice recommendations. A written personalized care plan for preventive services as well as general preventive health recommendations were provided to  patient.     Kellie Simmering, LPN   11/20/5275   Nurse Notes: none  Due to this being a virtual visit, the after visit summary with patients personalized plan was offered to patient via mail or my-chart. Patient would like to access on my-chart

## 2021-08-23 NOTE — Patient Instructions (Signed)
[name] , Thank you for taking time to come for your Medicare Wellness Visit. I appreciate your ongoing commitment to your health goals. Please review the following plan we discussed and let me know if I can assist you in the future.   Screening recommendations/referrals: Colonoscopy: completed 12/29/2018, due 12/29/2023 Recommended yearly ophthalmology/optometry visit for glaucoma screening and checkup Recommended yearly dental visit for hygiene and checkup  Vaccinations: Influenza vaccine: decline Pneumococcal vaccine: completed 07/29/2019 Tdap vaccine: due now Shingles vaccine:  discussed   Covid-19:  06/16/2021, 06/14/2020, 09/10/2019, 08/13/2019  Advanced directives: Please bring a copy of your POA (Power of Attorney) and/or Living Will to your next appointment.   Conditions/risks identified: none  Next appointment: Follow up in one year for your annual wellness visit.   Preventive Care 69 Years and Older, Male Preventive care refers to lifestyle choices and visits with your health care provider that can promote health and wellness. What does preventive care include? A yearly physical exam. This is also called an annual well check. Dental exams once or twice a year. Routine eye exams. Ask your health care provider how often you should have your eyes checked. Personal lifestyle choices, including: Daily care of your teeth and gums. Regular physical activity. Eating a healthy diet. Avoiding tobacco and drug use. Limiting alcohol use. Practicing safe sex. Taking low doses of aspirin every day. Taking vitamin and mineral supplements as recommended by your health care provider. What happens during an annual well check? The services and screenings done by your health care provider during your annual well check will depend on your age, overall health, lifestyle risk factors, and family history of disease. Counseling  Your health care provider may ask you questions about  your: Alcohol use. Tobacco use. Drug use. Emotional well-being. Home and relationship well-being. Sexual activity. Eating habits. History of falls. Memory and ability to understand (cognition). Work and work Statistician. Screening  You may have the following tests or measurements: Height, weight, and BMI. Blood pressure. Lipid and cholesterol levels. These may be checked every 5 years, or more frequently if you are over 69 years old. Skin check. Lung cancer screening. You may have this screening every year starting at age 69 if you have a 30-pack-year history of smoking and currently smoke or have quit within the past 15 years. Fecal occult blood test (FOBT) of the stool. You may have this test every year starting at age 69. Flexible sigmoidoscopy or colonoscopy. You may have a sigmoidoscopy every 5 years or a colonoscopy every 10 years starting at age 69. Prostate cancer screening. Recommendations will vary depending on your family history and other risks. Hepatitis C blood test. Hepatitis B blood test. Sexually transmitted disease (STD) testing. Diabetes screening. This is done by checking your blood sugar (glucose) after you have not eaten for a while (fasting). You may have this done every 1-3 years. Abdominal aortic aneurysm (AAA) screening. You may need this if you are a current or former smoker. Osteoporosis. You may be screened starting at age 69 if you are at high risk. Talk with your health care provider about your test results, treatment options, and if necessary, the need for more tests. Vaccines  Your health care provider may recommend certain vaccines, such as: Influenza vaccine. This is recommended every year. Tetanus, diphtheria, and acellular pertussis (Tdap, Td) vaccine. You may need a Td booster every 10 years. Zoster vaccine. You may need this after age 25. Pneumococcal 13-valent conjugate (PCV13) vaccine. One dose is  recommended after age 69. Pneumococcal  polysaccharide (PPSV23) vaccine. One dose is recommended after age 69. Talk to your health care provider about which screenings and vaccines you need and how often you need them. This information is not intended to replace advice given to you by your health care provider. Make sure you discuss any questions you have with your health care provider. Document Released: 07/28/2015 Document Revised: 03/20/2016 Document Reviewed: 05/02/2015 Elsevier Interactive Patient Education  2017 [employer] Prevention in the Home Falls can cause injuries. They can happen to people of all ages. There are many things you can do to make your home safe and to help prevent falls. What can I do on the outside of my home? Regularly fix the edges of walkways and driveways and fix any cracks. Remove anything that might make you trip as you walk through a door, such as a raised step or threshold. Trim any bushes or trees on the path to your home. Use bright outdoor lighting. Clear any walking paths of anything that might make someone trip, such as rocks or tools. Regularly check to see if handrails are loose or broken. Make sure that both sides of any steps have handrails. Any raised decks and porches should have guardrails on the edges. Have any leaves, snow, or ice cleared regularly. Use sand or salt on walking paths during winter. Clean up any spills in your garage right away. This includes oil or grease spills. What can I do in the bathroom? Use night lights. Install grab bars by the toilet and in the tub and shower. Do not use towel bars as grab bars. Use non-skid mats or decals in the tub or shower. If you need to sit down in the shower, use a plastic, non-slip stool. Keep the floor dry. Clean up any water that spills on the floor as soon as it happens. Remove soap buildup in the tub or shower regularly. Attach bath mats securely with double-sided non-slip rug tape. Do not have throw rugs and other  things on the floor that can make you trip. What can I do in the bedroom? Use night lights. Make sure that you have a light by your bed that is easy to reach. Do not use any sheets or blankets that are too big for your bed. They should not hang down onto the floor. Have a firm chair that has side arms. You can use this for support while you get dressed. Do not have throw rugs and other things on the floor that can make you trip. What can I do in the kitchen? Clean up any spills right away. Avoid walking on wet floors. Keep items that you use a lot in easy-to-reach places. If you need to reach something above you, use a strong step stool that has a grab bar. Keep electrical cords out of the way. Do not use floor polish or wax that makes floors slippery. If you must use wax, use non-skid floor wax. Do not have throw rugs and other things on the floor that can make you trip. What can I do with my stairs? Do not leave any items on the stairs. Make sure that there are handrails on both sides of the stairs and use them. Fix handrails that are broken or loose. Make sure that handrails are as long as the stairways. Check any carpeting to make sure that it is firmly attached to the stairs. Fix any carpet that is loose or worn. Avoid having  throw rugs at the top or bottom of the stairs. If you do have throw rugs, attach them to the floor with carpet tape. Make sure that you have a light switch at the top of the stairs and the bottom of the stairs. If you do not have them, ask someone to add them for you. What else can I do to help prevent falls? Wear shoes that: Do not have high heels. Have rubber bottoms. Are comfortable and fit you well. Are closed at the toe. Do not wear sandals. If you use a stepladder: Make sure that it is fully opened. Do not climb a closed stepladder. Make sure that both sides of the stepladder are locked into place. Ask someone to hold it for you, if possible. Clearly  mark and make sure that you can see: Any grab bars or handrails. First and last steps. Where the edge of each step is. Use tools that help you move around (mobility aids) if they are needed. These include: Canes. Walkers. Scooters. Crutches. Turn on the lights when you go into a dark area. Replace any light bulbs as soon as they burn out. Set up your furniture so you have a clear path. Avoid moving your furniture around. If any of your floors are uneven, fix them. If there are any pets around you, be aware of where they are. Review your medicines with your doctor. Some medicines can make you feel dizzy. This can increase your chance of falling. Ask your doctor what other things that you can do to help prevent falls. This information is not intended to replace advice given to you by your health care provider. Make sure you discuss any questions you have with your health care provider. Document Released: 04/27/2009 Document Revised: 12/07/2015 Document Reviewed: 08/05/2014 Elsevier Interactive Patient Education  2017 [employer].

## 2021-09-05 ENCOUNTER — Other Ambulatory Visit: Payer: Self-pay | Admitting: Family Medicine

## 2021-09-06 DIAGNOSIS — D692 Other nonthrombocytopenic purpura: Secondary | ICD-10-CM | POA: Diagnosis not present

## 2021-09-06 DIAGNOSIS — D485 Neoplasm of uncertain behavior of skin: Secondary | ICD-10-CM | POA: Diagnosis not present

## 2021-09-06 DIAGNOSIS — L814 Other melanin hyperpigmentation: Secondary | ICD-10-CM | POA: Diagnosis not present

## 2021-09-06 DIAGNOSIS — L080 Pyoderma: Secondary | ICD-10-CM | POA: Diagnosis not present

## 2021-09-06 DIAGNOSIS — L821 Other seborrheic keratosis: Secondary | ICD-10-CM | POA: Diagnosis not present

## 2021-09-06 DIAGNOSIS — D1801 Hemangioma of skin and subcutaneous tissue: Secondary | ICD-10-CM | POA: Diagnosis not present

## 2021-09-06 DIAGNOSIS — D1722 Benign lipomatous neoplasm of skin and subcutaneous tissue of left arm: Secondary | ICD-10-CM | POA: Diagnosis not present

## 2021-11-13 DIAGNOSIS — H401213 Low-tension glaucoma, right eye, severe stage: Secondary | ICD-10-CM | POA: Diagnosis not present

## 2021-11-13 DIAGNOSIS — H401222 Low-tension glaucoma, left eye, moderate stage: Secondary | ICD-10-CM | POA: Diagnosis not present

## 2022-02-12 ENCOUNTER — Encounter: Payer: Self-pay | Admitting: Family Medicine

## 2022-02-12 ENCOUNTER — Ambulatory Visit (INDEPENDENT_AMBULATORY_CARE_PROVIDER_SITE_OTHER): Payer: Medicare HMO | Admitting: Family Medicine

## 2022-02-12 VITALS — BP 126/78 | HR 50 | Temp 97.3°F | Resp 18 | Ht 72.0 in | Wt 251.6 lb

## 2022-02-12 DIAGNOSIS — E039 Hypothyroidism, unspecified: Secondary | ICD-10-CM | POA: Diagnosis not present

## 2022-02-12 DIAGNOSIS — K429 Umbilical hernia without obstruction or gangrene: Secondary | ICD-10-CM | POA: Diagnosis not present

## 2022-02-12 DIAGNOSIS — K469 Unspecified abdominal hernia without obstruction or gangrene: Secondary | ICD-10-CM | POA: Insufficient documentation

## 2022-02-12 DIAGNOSIS — E785 Hyperlipidemia, unspecified: Secondary | ICD-10-CM

## 2022-02-12 DIAGNOSIS — E669 Obesity, unspecified: Secondary | ICD-10-CM | POA: Diagnosis not present

## 2022-02-12 LAB — TSH: TSH: 5.25 u[IU]/mL (ref 0.35–5.50)

## 2022-02-12 LAB — CBC WITH DIFFERENTIAL/PLATELET
Basophils Absolute: 0 10*3/uL (ref 0.0–0.1)
Basophils Relative: 0.6 % (ref 0.0–3.0)
Eosinophils Absolute: 0.1 10*3/uL (ref 0.0–0.7)
Eosinophils Relative: 1.3 % (ref 0.0–5.0)
HCT: 38.1 % — ABNORMAL LOW (ref 39.0–52.0)
Hemoglobin: 12.1 g/dL — ABNORMAL LOW (ref 13.0–17.0)
Lymphocytes Relative: 34.8 % (ref 12.0–46.0)
Lymphs Abs: 1.4 10*3/uL (ref 0.7–4.0)
MCHC: 31.7 g/dL (ref 30.0–36.0)
MCV: 75.2 fl — ABNORMAL LOW (ref 78.0–100.0)
Monocytes Absolute: 0.3 10*3/uL (ref 0.1–1.0)
Monocytes Relative: 7.2 % (ref 3.0–12.0)
Neutro Abs: 2.2 10*3/uL (ref 1.4–7.7)
Neutrophils Relative %: 56.1 % (ref 43.0–77.0)
Platelets: 128 10*3/uL — ABNORMAL LOW (ref 150.0–400.0)
RBC: 5.06 Mil/uL (ref 4.22–5.81)
RDW: 16.6 % — ABNORMAL HIGH (ref 11.5–15.5)
WBC: 4 10*3/uL (ref 4.0–10.5)

## 2022-02-12 LAB — HEPATIC FUNCTION PANEL
ALT: 17 U/L (ref 0–53)
AST: 22 U/L (ref 0–37)
Albumin: 4.2 g/dL (ref 3.5–5.2)
Alkaline Phosphatase: 46 U/L (ref 39–117)
Bilirubin, Direct: 0.1 mg/dL (ref 0.0–0.3)
Total Bilirubin: 1 mg/dL (ref 0.2–1.2)
Total Protein: 7.3 g/dL (ref 6.0–8.3)

## 2022-02-12 LAB — BASIC METABOLIC PANEL
BUN: 13 mg/dL (ref 6–23)
CO2: 25 mEq/L (ref 19–32)
Calcium: 9.3 mg/dL (ref 8.4–10.5)
Chloride: 102 mEq/L (ref 96–112)
Creatinine, Ser: 1.05 mg/dL (ref 0.40–1.50)
GFR: 72.83 mL/min (ref >60.00)
Glucose, Bld: 87 mg/dL (ref 70–99)
Potassium: 4.5 mEq/L (ref 3.5–5.1)
Sodium: 136 mEq/L (ref 135–145)

## 2022-02-12 LAB — LIPID PANEL
Cholesterol: 164 mg/dL (ref 0–200)
HDL: 32.9 mg/dL — ABNORMAL LOW (ref >39.00)
LDL Cholesterol: 92 mg/dL (ref 0–99)
NonHDL: 130.92
Total CHOL/HDL Ratio: 5
Triglycerides: 193 mg/dL — ABNORMAL HIGH (ref 0.0–149.0)
VLDL: 38.6 mg/dL (ref 0.0–40.0)

## 2022-02-12 NOTE — Assessment & Plan Note (Signed)
Chronic problem, on Lipitor 10mg daily w/o difficulty.  Check labs.  Adjust meds prn  

## 2022-02-12 NOTE — Assessment & Plan Note (Signed)
Deteriorated.  Pt has gained 15 lbs since Feb.  Encouraged low carb diet and regular exercise.  Will continue to follow.

## 2022-02-12 NOTE — Progress Notes (Signed)
   Subjective:    Patient ID: Jerome Scott, male    DOB: July 30, 1950, 71 y.o.   MRN: [id number]  HPI Hyperlipidemia- chronic problem, on Lipitor '10mg'$  daily.  No CP, SOB, abd pain, N/V.  Hypothyroid- noted on labs done in January.  Started on Levothyroxine 22mg daily.  Denies fatigue, changes to skin/hair/nails.  Abd hernia- denies pain.  Has never consulted w/ surgeon.  Thinks he would like to  Obesity- pt has gained 15 lbs since Feb.  [place] [facility] [place] [place]  Elevated BP- pt doesn't have a hx of HTN but initial BP today is elevated at 140/80.  Denies HAs, visual changes.   Review of Systems For ROS see HPI     Objective:   Physical Exam Vitals reviewed.  Constitutional:      General: He is not in acute distress.    Appearance: Normal appearance. He is well-developed. He is obese. He is not ill-appearing.  HENT:     Head: Normocephalic and atraumatic.  Eyes:     Extraocular Movements: Extraocular movements intact.     Conjunctiva/sclera: Conjunctivae normal.     Pupils: Pupils are equal, round, and reactive to light.  Neck:     Thyroid: No thyromegaly.  Cardiovascular:     Rate and Rhythm: Normal rate and regular rhythm.     Pulses: Normal pulses.     Heart sounds: Normal heart sounds. No murmur heard. Pulmonary:     Effort: Pulmonary effort is normal. No respiratory distress.     Breath sounds: Normal breath sounds.  Abdominal:     General: Bowel sounds are normal. There is no distension.     Palpations: Abdomen is soft.     Hernia: A hernia (large umbilical hernia) is present.  Musculoskeletal:     Cervical back: Normal range of motion and neck supple.     Right lower leg: No edema.     Left lower leg: No edema.  Lymphadenopathy:     Cervical: No cervical adenopathy.  Skin:    General: Skin is warm and dry.  Neurological:     General: No focal deficit present.     Mental Status: He is alert and oriented to person, place, and time.      Cranial Nerves: No cranial nerve deficit.  Psychiatric:        Mood and Affect: Mood normal.        Behavior: Behavior normal.           Assessment & Plan:

## 2022-02-12 NOTE — Assessment & Plan Note (Signed)
New.  Noted in January at [facility].  Pt is tolerating Levothyroxine 47mg daily w/o difficulty.  Currently asymptomatic.  Check labs.  Adjust meds prn

## 2022-02-12 NOTE — Assessment & Plan Note (Signed)
Large umbilical hernia.  Pt denies pain but would like to get this evaluated and corrected.  Referral to surgery placed.

## 2022-02-12 NOTE — Patient Instructions (Signed)
Schedule your complete physical in 6 months We'll notify you of your lab results and make any changes if needed Continue to work on healthy diet and regular exercise- you can do it! They will call you to schedule your surgery appt Call with any questions or concerns Stay Safe!  Stay Healthy! Enjoy the rest of your summer!!!

## 2022-02-13 ENCOUNTER — Telehealth: Payer: Self-pay

## 2022-02-13 NOTE — Telephone Encounter (Signed)
-----   Message from Midge Minium, MD sent at 02/13/2022  7:22 AM EDT ----- Labs are stable and look good.  No changes at this time

## 2022-02-13 NOTE — Telephone Encounter (Signed)
Left pt a vm stating lab results  

## 2022-02-15 ENCOUNTER — Other Ambulatory Visit: Payer: Self-pay | Admitting: Family Medicine

## 2022-03-03 ENCOUNTER — Other Ambulatory Visit: Payer: Self-pay | Admitting: Family Medicine

## 2022-03-06 DIAGNOSIS — K429 Umbilical hernia without obstruction or gangrene: Secondary | ICD-10-CM | POA: Diagnosis not present

## 2022-04-13 ENCOUNTER — Other Ambulatory Visit: Payer: Self-pay | Admitting: Family Medicine

## 2022-05-15 HISTORY — PX: OTHER SURGICAL HISTORY: SHX169

## 2022-05-20 DIAGNOSIS — H401213 Low-tension glaucoma, right eye, severe stage: Secondary | ICD-10-CM | POA: Diagnosis not present

## 2022-05-20 DIAGNOSIS — H401222 Low-tension glaucoma, left eye, moderate stage: Secondary | ICD-10-CM | POA: Diagnosis not present

## 2022-05-31 DIAGNOSIS — K429 Umbilical hernia without obstruction or gangrene: Secondary | ICD-10-CM | POA: Diagnosis not present

## 2022-07-05 DIAGNOSIS — K429 Umbilical hernia without obstruction or gangrene: Secondary | ICD-10-CM | POA: Diagnosis not present

## 2022-07-05 DIAGNOSIS — Z9889 Other specified postprocedural states: Secondary | ICD-10-CM | POA: Diagnosis not present

## 2022-08-09 ENCOUNTER — Other Ambulatory Visit: Payer: Self-pay | Admitting: Family Medicine

## 2022-08-13 ENCOUNTER — Telehealth: Payer: Self-pay | Admitting: Family Medicine

## 2022-08-13 NOTE — Telephone Encounter (Signed)
Left message for patient to call back and schedule Medicare Annual Wellness Visit (AWV) in office.   If not able to come in office, please offer to do virtually or by telephone.  Left office number and my jabber [phone].  Last AWV:08/23/2021   Please schedule at anytime with Nurse Health Advisor.

## 2022-08-20 ENCOUNTER — Encounter: Payer: Self-pay | Admitting: Family Medicine

## 2022-08-20 ENCOUNTER — Ambulatory Visit (INDEPENDENT_AMBULATORY_CARE_PROVIDER_SITE_OTHER): Payer: Medicare Other | Admitting: Family Medicine

## 2022-08-20 VITALS — BP 122/76 | HR 55 | Temp 98.1°F | Resp 17 | Ht 72.0 in | Wt 250.2 lb

## 2022-08-20 DIAGNOSIS — Z0000 Encounter for general adult medical examination without abnormal findings: Secondary | ICD-10-CM | POA: Diagnosis not present

## 2022-08-20 DIAGNOSIS — E785 Hyperlipidemia, unspecified: Secondary | ICD-10-CM | POA: Diagnosis not present

## 2022-08-20 DIAGNOSIS — Z125 Encounter for screening for malignant neoplasm of prostate: Secondary | ICD-10-CM

## 2022-08-20 LAB — HEPATIC FUNCTION PANEL
ALT: 16 U/L (ref 0–53)
AST: 18 U/L (ref 0–37)
Albumin: 4.2 g/dL (ref 3.5–5.2)
Alkaline Phosphatase: 45 U/L (ref 39–117)
Bilirubin, Direct: 0.2 mg/dL (ref 0.0–0.3)
Total Bilirubin: 1.1 mg/dL (ref 0.2–1.2)
Total Protein: 6.9 g/dL (ref 6.0–8.3)

## 2022-08-20 LAB — CBC WITH DIFFERENTIAL/PLATELET
Basophils Absolute: 0 10*3/uL (ref 0.0–0.1)
Basophils Relative: 1 % (ref 0.0–3.0)
Eosinophils Absolute: 0 10*3/uL (ref 0.0–0.7)
Eosinophils Relative: 0.8 % (ref 0.0–5.0)
HCT: 37.6 % — ABNORMAL LOW (ref 39.0–52.0)
Hemoglobin: 12.2 g/dL — ABNORMAL LOW (ref 13.0–17.0)
Lymphocytes Relative: 37.3 % (ref 12.0–46.0)
Lymphs Abs: 1.3 10*3/uL (ref 0.7–4.0)
MCHC: 32.4 g/dL (ref 30.0–36.0)
MCV: 74 fl — ABNORMAL LOW (ref 78.0–100.0)
Monocytes Absolute: 0.3 10*3/uL (ref 0.1–1.0)
Monocytes Relative: 8.7 % (ref 3.0–12.0)
Neutro Abs: 1.9 10*3/uL (ref 1.4–7.7)
Neutrophils Relative %: 52.2 % (ref 43.0–77.0)
Platelets: 130 10*3/uL — ABNORMAL LOW (ref 150.0–400.0)
RBC: 5.08 Mil/uL (ref 4.22–5.81)
RDW: 16.2 % — ABNORMAL HIGH (ref 11.5–15.5)
WBC: 3.6 10*3/uL — ABNORMAL LOW (ref 4.0–10.5)

## 2022-08-20 LAB — BASIC METABOLIC PANEL
BUN: 16 mg/dL (ref 6–23)
CO2: 24 mEq/L (ref 19–32)
Calcium: 9 mg/dL (ref 8.4–10.5)
Chloride: 105 mEq/L (ref 96–112)
Creatinine, Ser: 1.11 mg/dL (ref 0.40–1.50)
GFR: 67.88 mL/min (ref >60.00)
Glucose, Bld: 105 mg/dL — ABNORMAL HIGH (ref 70–99)
Potassium: 4.2 mEq/L (ref 3.5–5.1)
Sodium: 138 mEq/L (ref 135–145)

## 2022-08-20 LAB — LIPID PANEL
Cholesterol: 149 mg/dL (ref 0–200)
HDL: 30.5 mg/dL — ABNORMAL LOW (ref >39.00)
LDL Cholesterol: 95 mg/dL (ref 0–99)
NonHDL: 118.76
Total CHOL/HDL Ratio: 5
Triglycerides: 118 mg/dL (ref 0.0–149.0)
VLDL: 23.6 mg/dL (ref 0.0–40.0)

## 2022-08-20 LAB — PSA, MEDICARE: PSA: 3.73 ng/ml (ref 0.10–4.00)

## 2022-08-20 LAB — TSH: TSH: 3.15 u[IU]/mL (ref 0.35–5.50)

## 2022-08-20 NOTE — Patient Instructions (Signed)
Follow up in 6 months to recheck cholesterol We'll notify you of your lab results and make any changes if needed Keep up the good work on healthy diet and regular exercise- you're doing great! Call with any questions or concerns Stay Safe!!  Stay Healthy!!

## 2022-08-20 NOTE — Assessment & Plan Note (Signed)
Chronic problem.  Tolerating statin w/o difficulty.  Check labs.  Adjust meds prn  

## 2022-08-20 NOTE — Assessment & Plan Note (Signed)
Pt's PE WNL w/ exception of BMI and known cyst on L side of neck.  UTD on colonoscopy, PNA vaccines.  Check labs.  Anticipatory guidance provided.

## 2022-08-20 NOTE — Progress Notes (Signed)
   Subjective:    Patient ID: Jerome Scott, male    DOB: [birth year], 72 y.o.   MRN: [id number]  HPI CPE- UTD on colonoscopy, PNA vaccines.  No concerns today.  Pt reports feeling well.  Patient Care Team    Relationship Specialty Notifications Start End  Midge Minium, MD PCP - General Family Medicine  07/21/17   Laurence Spates, MD (Inactive) Consulting Physician Gastroenterology  07/29/19     Health Maintenance  Topic Date Due   Medicare Annual Wellness (AWV)  08/23/2022   INFLUENZA VACCINE  10/13/2022 (Originally 02/12/2022)   COLONOSCOPY (Pts 45-63yr Insurance coverage will need to be confirmed)  12/29/2023   Pneumonia Vaccine 70 Years old  Completed   Hepatitis C Screening  Completed   HPV VACCINES  Aged Out   DTaP/Tdap/Td  Discontinued   COVID-19 Vaccine  Discontinued   Zoster Vaccines- Shingrix  Discontinued      Review of Systems Patient reports no vision/hearing changes, anorexia, fever ,adenopathy, persistant/recurrent hoarseness, swallowing issues, chest pain, palpitations, edema, persistant/recurrent cough, hemoptysis, dyspnea (rest,exertional, paroxysmal nocturnal), gastrointestinal  bleeding (melena, rectal bleeding), abdominal pain, excessive heart burn, GU symptoms (dysuria, hematuria, voiding/incontinence issues) syncope, focal weakness, memory loss, numbness & tingling, skin/hair/nail changes, depression, anxiety, abnormal bruising/bleeding, musculoskeletal symptoms/signs.     Objective:   Physical Exam General Appearance:    Alert, cooperative, no distress, appears stated age  Head:    Normocephalic, without obvious abnormality, atraumatic  Eyes:    PERRL, conjunctiva/corneas clear, EOM's intact both eyes       Ears:    Normal TM's and external ear canals, both ears  Nose:   Nares normal, septum midline, mucosa normal, no drainage   or sinus tenderness  Throat:   Lips, mucosa, and tongue normal; teeth and gums normal  Neck:   Supple, symmetrical, trachea  midline, no adenopathy,  cyst L side of neck;       thyroid:  No enlargement/tenderness/nodules  Back:     Symmetric, no curvature, ROM normal, no CVA tenderness  Lungs:     Clear to auscultation bilaterally, respirations unlabored  Chest wall:    No tenderness or deformity  Heart:    Regular rate and rhythm, S1 and S2 normal, no murmur, rub   or gallop  Abdomen:     Soft, non-tender, bowel sounds active all four quadrants,    no masses, no organomegaly  Genitalia:    deferred  Rectal:    Extremities:   Extremities normal, atraumatic, no cyanosis or edema  Pulses:   2+ and symmetric all extremities  Skin:   Skin color, texture, turgor normal, no rashes or lesions  Lymph nodes:   Cervical, supraclavicular, and axillary nodes normal  Neurologic:   CNII-XII intact. Normal strength, sensation and reflexes      throughout          Assessment & Plan:

## 2022-08-21 ENCOUNTER — Telehealth: Payer: Self-pay

## 2022-08-21 NOTE — Telephone Encounter (Signed)
-----   Message from Midge Minium, MD sent at 08/21/2022  7:34 AM EST ----- Labs look great!  No changes at this time

## 2022-08-21 NOTE — Telephone Encounter (Signed)
Informed pt of lab results  

## 2022-08-30 ENCOUNTER — Other Ambulatory Visit: Payer: Self-pay | Admitting: Family Medicine

## 2022-10-03 ENCOUNTER — Telehealth: Payer: Self-pay | Admitting: Family Medicine

## 2022-10-03 NOTE — Telephone Encounter (Signed)
Called patient to schedule Medicare Annual Wellness Visit (AWV). Left message for patient to call back and schedule Medicare Annual Wellness Visit (AWV).  Last date of AWV: 08/23/2021   Please schedule an AWVS appointment at any time with [place] VISIT.  If any questions, please contact me at [phone].    Thank you,  [place] Direct dial  [phone]

## 2022-10-28 ENCOUNTER — Telehealth: Payer: Self-pay | Admitting: Family Medicine

## 2022-10-28 NOTE — Telephone Encounter (Signed)
Called patient to schedule Medicare Annual Wellness Visit (AWV). Left message for patient to call back and schedule Medicare Annual Wellness Visit (AWV).  Last date of AWV: 08/23/2021   Please schedule an AWVS appointment at any time with [facility] ANNUAL WELLNESS VISIT.  If any questions, please contact me at [phone].    Thank you,  Amie Cowens  [facility] Support [facility] Direct dial  [phone]   

## 2022-11-13 ENCOUNTER — Telehealth: Payer: Self-pay | Admitting: Family Medicine

## 2022-11-13 NOTE — Telephone Encounter (Signed)
Contacted Lysbeth Galas to schedule their annual wellness visit. Appointment made for 11/14/2022.  Thank you,  [facility] Support [facility] Direct dial  [phone]

## 2022-11-14 ENCOUNTER — Ambulatory Visit (INDEPENDENT_AMBULATORY_CARE_PROVIDER_SITE_OTHER): Payer: Medicare Other | Admitting: *Deleted

## 2022-11-14 DIAGNOSIS — Z0000 Encounter for general adult medical examination without abnormal findings: Secondary | ICD-10-CM

## 2022-11-14 NOTE — Progress Notes (Signed)
Subjective:   Jerome Scott is a 72 y.o. male who presents for Medicare Annual/Subsequent preventive examination.  I connected with  Lysbeth Galas on 11/14/22 by a telephone enabled telemedicine application and verified that I am speaking with the correct person using two identifiers.   I discussed the limitations of evaluation and management by telemedicine. The patient expressed understanding and agreed to proceed.  Patient location: home  Provider location: telephone/home    Review of Systems     Cardiac Risk Factors include: advanced age (>60men, >33 women);diabetes mellitus;male gender;obesity (BMI >30kg/m2)     Objective:    Today's Vitals   There is no height or weight on file to calculate BMI.     11/14/2022    9:08 AM 08/23/2021    9:01 AM 08/14/2020   10:40 AM 07/23/2018   10:05 AM  Advanced Directives  Does Patient Have a Medical Advance Directive? Yes Yes No;Yes Yes  Type of Estate agent of [employer] Power of [place];Living will Healthcare Power of [place];Living will Healthcare Power of [place];Living will  Does patient want to make changes to medical advance directive?    No - Patient declined  Copy of Healthcare Power of Attorney in Chart? No - copy requested No - copy requested No - copy requested No - copy requested    Current Medications (verified) Outpatient Encounter Medications as of 11/14/2022  Medication Sig   atorvastatin (LIPITOR) 10 MG tablet TAKE 1 TABLET BY MOUTH EVERY DAY   dorzolamide-timolol (COSOPT) 22.3-6.8 MG/ML ophthalmic solution INSTILL 1 DROP INTO BOTH EYES TWICE A DAY   latanoprost (XALATAN) 0.005 % ophthalmic solution INSTILL 1 DROP INTO BOTH EYES EVERY DAY IN THE EVENING   levothyroxine (SYNTHROID) 50 MCG tablet TAKE 1 TABLET BY MOUTH EVERY DAY   omeprazole (PRILOSEC) 20 MG capsule TAKE 1 CAPSULE BY MOUTH EVERY DAY   No facility-administered encounter medications on file as of 11/14/2022.     Allergies (verified) Patient has no known allergies.   History: Past Medical History:  Diagnosis Date   Blood in stool    GERD (gastroesophageal reflux disease)    Past Surgical History:  Procedure Laterality Date   umbical hernia  05/2022   Family History  Problem Relation Age of Onset   Hyperlipidemia Mother    Hypertension Mother    Heart disease Father    Cancer Father        brain tumor   Diabetes Father    Heart attack Father    Diabetes Sister    Heart attack Sister    Hyperlipidemia Sister    Hypertension Sister    Diabetes Brother    Alcohol abuse Daughter    Heart attack Maternal Grandmother    Heart attack Maternal Grandfather    Alcohol abuse Paternal Grandfather    Heart attack Brother    Hyperlipidemia Brother    Hypertension Brother    Heart attack Brother    Hyperlipidemia Brother    Hypertension Brother    Social History   Socioeconomic History   Marital status: Married    Spouse name: Not on file   Number of children: Not on file   Years of education: Not on file   Highest education level: Not on file  Occupational History   Not on file  Tobacco Use   Smoking status: Never    Passive exposure: Past   Smokeless tobacco: Never  Vaping Use   Vaping Use: Never used  Substance and Sexual  Activity   Alcohol use: No   Drug use: No   Sexual activity: Yes  Other Topics Concern   Not on file  Social History Narrative   Not on file   Social Determinants of Health   Financial Resource Strain: Low Risk  (11/14/2022)   Overall Financial Resource Strain (CARDIA)    Difficulty of Paying Living Expenses: Not hard at all  Food Insecurity: No Food Insecurity (11/14/2022)   Hunger Vital Sign    Worried About Running Out of Food in the Last Year: Never true    Ran Out of Food in the Last Year: Never true  Transportation Needs: No Transportation Needs (11/14/2022)   PRAPARE - Administrator, Civil Service (Medical): No    Lack of  Transportation (Non-Medical): No  Physical Activity: Inactive (11/14/2022)   Exercise Vital Sign    Days of Exercise per Week: 0 days    Minutes of Exercise per Session: 0 min  Stress: No Stress Concern Present (11/14/2022)   [employer] of Occupational Health - Occupational Stress Questionnaire    Feeling of Stress : Not at all  Social Connections: Moderately Integrated (11/14/2022)   Social Connection and Isolation Panel [NHANES]    Frequency of Communication with Friends and Family: More than three times a week    Frequency of Social Gatherings with Friends and Family: Three times a week    Attends Religious Services: More than 4 times per year    Active Member of Clubs or Organizations: No    Attends Banker Meetings: Never    Marital Status: Married    Tobacco Counseling Counseling given: Not Answered   Clinical Intake:  Pre-visit preparation completed: Yes  Pain : No/denies pain        How often do you need to have someone help you when you read instructions, pamphlets, or other written materials from your doctor or pharmacy?: 1 - Never  Diabetic?  no  Interpreter Needed?: No  Information entered by :: Remi Haggard LPN   Activities of Daily Living    11/14/2022    9:10 AM 08/20/2022    7:45 AM  In your present state of health, do you have any difficulty performing the following activities:  Hearing? 0 0  Vision? 0 0  Difficulty concentrating or making decisions? 0 0  Walking or climbing stairs? 0 0  Dressing or bathing? 0 0  Doing errands, shopping?  0  Preparing Food and eating ? N   Using the Toilet? N   In the past six months, have you accidently leaked urine? N   Do you have problems with loss of bowel control? N   Managing your Medications? N   Managing your Finances? N   Housekeeping or managing your Housekeeping? N     Patient Care Team: Sheliah Hatch, MD as PCP - General (Family Medicine) Carman Ching, MD (Inactive) as  Consulting Physician (Gastroenterology)  Indicate any recent Medical Services you may have received from other than [facility] providers in the past year (date may be approximate).     Assessment:   This is a routine wellness examination for Jerome Scott.  Hearing/Vision screen Hearing Screening - Comments:: No trouble hearing Vision Screening - Comments:: [facility] Up to date  Dietary issues and exercise activities discussed: Current Exercise Habits: The patient does not participate in regular exercise at present   Goals Addressed             This Visit's  Progress    Patient Stated   Not on track    Increase activity     Patient Stated       Continue current lifestyle       Depression Screen    11/14/2022    9:12 AM 08/20/2022    7:44 AM 02/12/2022    8:40 AM 08/23/2021    9:02 AM 08/15/2021    7:42 AM 02/12/2021   12:26 PM 08/15/2020    9:58 AM  PHQ 2/9 Scores  PHQ - 2 Score 0 0 0 0 0 0 0  PHQ- 9 Score 0 0 0  0  0    Fall Risk    11/14/2022    9:07 AM 08/20/2022    7:44 AM 02/12/2022    8:41 AM 08/23/2021    9:01 AM 08/15/2021    7:43 AM  Fall Risk   Falls in the past year? 0 0 0 0 0  Number falls in past yr: 0 0 0    Injury with Fall? 0 0 0    Risk for fall due to :  No Fall Risks No Fall Risks No Fall Risks No Fall Risks  Follow up Falls evaluation completed;Education provided;Falls prevention discussed Falls evaluation completed Falls evaluation completed Falls evaluation completed;Education provided;Falls prevention discussed Falls evaluation completed    FALL RISK PREVENTION PERTAINING TO THE HOME:  Any stairs in or around the home? Yes  If so, are there any without handrails? No  Home free of loose throw rugs in walkways, pet beds, electrical cords, etc? Yes  Adequate lighting in your home to reduce risk of falls? Yes   ASSISTIVE DEVICES UTILIZED TO PREVENT FALLS:  Life alert? No  Use of a cane, walker or w/c? No  Grab bars in the bathroom? Yes  Shower chair or bench in  shower? Yes  Elevated toilet seat or a handicapped toilet? No   TIMED UP AND GO:  Was the test performed? No .    Cognitive Function:        11/14/2022    9:09 AM 08/23/2021    9:03 AM  6CIT Screen  What Year? 0 points 0 points  What month? 0 points 0 points  What time? 0 points 0 points  Count back from 20 0 points 0 points  Months in reverse 0 points 0 points  Repeat phrase 0 points 4 points  Total Score 0 points 4 points    Immunizations Immunization History  Administered Date(s) Administered   Moderna Covid-19 Vaccine Bivalent Booster 86yrs & up 06/16/2021   Moderna Sars-Covid-2 Vaccination 08/13/2019, 09/10/2019, 06/14/2020   Pneumococcal Conjugate-13 07/23/2018   Pneumococcal Polysaccharide-23 07/29/2019   Tdap 02/01/2011    TDAP status: Due, Education has been provided regarding the importance of this vaccine. Advised may receive this vaccine at local pharmacy or Health Dept. Aware to provide a copy of the vaccination record if obtained from local pharmacy or Health Dept. Verbalized acceptance and understanding.  Flu Vaccine status: Due, Education has been provided regarding the importance of this vaccine. Advised may receive this vaccine at local pharmacy or Health Dept. Aware to provide a copy of the vaccination record if obtained from local pharmacy or Health Dept. Verbalized acceptance and understanding.  Pneumococcal vaccine status: Up to date  Covid-19 vaccine status: Information provided on how to obtain vaccines.   Qualifies for Shingles Vaccine? Yes   Zostavax completed No   Shingrix Completed?: No.    Education has been provided  regarding the importance of this vaccine. Patient has been advised to call insurance company to determine out of pocket expense if they have not yet received this vaccine. Advised may also receive vaccine at local pharmacy or Health Dept. Verbalized acceptance and understanding.  Screening Tests Health Maintenance  Topic Date Due    INFLUENZA VACCINE  02/13/2023   Medicare Annual Wellness (AWV)  11/14/2023   COLONOSCOPY (Pts 45-66yrs Insurance coverage will need to be confirmed)  12/29/2023   Pneumonia Vaccine 66+ Years old  Completed   Hepatitis C Screening  Completed   HPV VACCINES  Aged Out   DTaP/Tdap/Td  Discontinued   COVID-19 Vaccine  Discontinued   Zoster Vaccines- Shingrix  Discontinued    Health Maintenance  There are no preventive care reminders to display for this patient.   Colorectal cancer screening: Type of screening: Colonoscopy. Completed 2020. Repeat every 5 years  Lung Cancer Screening: (Low Dose CT Chest recommended if Age 83-80 years, 30 pack-year currently smoking OR have quit w/in 15years.) does not qualify.   Lung Cancer Screening Referral:   Additional Screening:  Hepatitis C Screening: does not qualify; Completed 2022  Vision Screening: Recommended annual ophthalmology exams for early detection of glaucoma and other disorders of the eye. Is the patient up to date with their annual eye exam?  Yes  Who is the provider or what is the name of the office in which the patient attends annual eye exams? [facility] If pt is not established with a provider, would they like to be referred to a provider to establish care? No .   Dental Screening: Recommended annual dental exams for proper oral hygiene  Community Resource Referral / Chronic Care Management: CRR required this visit?  No   CCM required this visit?  No      Plan:     I have personally reviewed and noted the following in the patient's chart:   Medical and social history Use of alcohol, tobacco or illicit drugs  Current medications and supplements including opioid prescriptions. Patient is not currently taking opioid prescriptions. Functional ability and status Nutritional status Physical activity Advanced directives List of other physicians Hospitalizations, surgeries, and ER visits in previous 12  months Vitals Screenings to include cognitive, depression, and falls Referrals and appointments  In addition, I have reviewed and discussed with patient certain preventive protocols, quality metrics, and best practice recommendations. A written personalized care plan for preventive services as well as general preventive health recommendations were provided to patient.     Remi Haggard, LPN   12/21/6293   Nurse Notes:

## 2022-11-14 NOTE — Patient Instructions (Signed)
[name] , Thank you for taking time to come for your Medicare Wellness Visit. I appreciate your ongoing commitment to your health goals. Please review the following plan we discussed and let me know if I can assist you in the future.   Screening recommendations/referrals: Colonoscopy: up to date Recommended yearly ophthalmology/optometry visit for glaucoma screening and checkup Recommended yearly dental visit for hygiene and checkup  Vaccinations: Influenza vaccine: up to date Pneumococcal vaccine: up to date Tdap vaccine: Education provided Shingles vaccine: Education provided    Advanced directives: not on file     Preventive Care 65 Years and Older, Male Preventive care refers to lifestyle choices and visits with your health care provider that can promote health and wellness. What does preventive care include? A yearly physical exam. This is also called an annual well check. Dental exams once or twice a year. Routine eye exams. Ask your health care provider how often you should have your eyes checked. Personal lifestyle choices, including: Daily care of your teeth and gums. Regular physical activity. Eating a healthy diet. Avoiding tobacco and drug use. Limiting alcohol use. Practicing safe sex. Taking low doses of aspirin every day. Taking vitamin and mineral supplements as recommended by your health care provider. What happens during an annual well check? The services and screenings done by your health care provider during your annual well check will depend on your age, overall health, lifestyle risk factors, and family history of disease. Counseling  Your health care provider may ask you questions about your: Alcohol use. Tobacco use. Drug use. Emotional well-being. Home and relationship well-being. Sexual activity. Eating habits. History of falls. Memory and ability to understand (cognition). Work and work Astronomer. Screening  You may have the following  tests or measurements: Height, weight, and BMI. Blood pressure. Lipid and cholesterol levels. These may be checked every 5 years, or more frequently if you are over 70 years old. Skin check. Lung cancer screening. You may have this screening every year starting at age 70 if you have a 30-pack-year history of smoking and currently smoke or have quit within the past 15 years. Fecal occult blood test (FOBT) of the stool. You may have this test every year starting at age 70. Flexible sigmoidoscopy or colonoscopy. You may have a sigmoidoscopy every 5 years or a colonoscopy every 10 years starting at age 70. Prostate cancer screening. Recommendations will vary depending on your family history and other risks. Hepatitis C blood test. Hepatitis B blood test. Sexually transmitted disease (STD) testing. Diabetes screening. This is done by checking your blood sugar (glucose) after you have not eaten for a while (fasting). You may have this done every 1-3 years. Abdominal aortic aneurysm (AAA) screening. You may need this if you are a current or former smoker. Osteoporosis. You may be screened starting at age 70 if you are at high risk. Talk with your health care provider about your test results, treatment options, and if necessary, the need for more tests. Vaccines  Your health care provider may recommend certain vaccines, such as: Influenza vaccine. This is recommended every year. Tetanus, diphtheria, and acellular pertussis (Tdap, Td) vaccine. You may need a Td booster every 10 years. Zoster vaccine. You may need this after age 70. Pneumococcal 13-valent conjugate (PCV13) vaccine. One dose is recommended after age 70. Pneumococcal polysaccharide (PPSV23) vaccine. One dose is recommended after age 70. Talk to your health care provider about which screenings and vaccines you need and how often you need them.  This information is not intended to replace advice given to you by your health care provider.  Make sure you discuss any questions you have with your health care provider. Document Released: 07/28/2015 Document Revised: 03/20/2016 Document Reviewed: 05/02/2015 Elsevier Interactive Patient Education  2017 [employer] Prevention in the Home Falls can cause injuries. They can happen to people of all ages. There are many things you can do to make your home safe and to help prevent falls. What can I do on the outside of my home? Regularly fix the edges of walkways and driveways and fix any cracks. Remove anything that might make you trip as you walk through a door, such as a raised step or threshold. Trim any bushes or trees on the path to your home. Use bright outdoor lighting. Clear any walking paths of anything that might make someone trip, such as rocks or tools. Regularly check to see if handrails are loose or broken. Make sure that both sides of any steps have handrails. Any raised decks and porches should have guardrails on the edges. Have any leaves, snow, or ice cleared regularly. Use sand or salt on walking paths during winter. Clean up any spills in your garage right away. This includes oil or grease spills. What can I do in the bathroom? Use night lights. Install grab bars by the toilet and in the tub and shower. Do not use towel bars as grab bars. Use non-skid mats or decals in the tub or shower. If you need to sit down in the shower, use a plastic, non-slip stool. Keep the floor dry. Clean up any water that spills on the floor as soon as it happens. Remove soap buildup in the tub or shower regularly. Attach bath mats securely with double-sided non-slip rug tape. Do not have throw rugs and other things on the floor that can make you trip. What can I do in the bedroom? Use night lights. Make sure that you have a light by your bed that is easy to reach. Do not use any sheets or blankets that are too big for your bed. They should not hang down onto the floor. Have a  firm chair that has side arms. You can use this for support while you get dressed. Do not have throw rugs and other things on the floor that can make you trip. What can I do in the kitchen? Clean up any spills right away. Avoid walking on wet floors. Keep items that you use a lot in easy-to-reach places. If you need to reach something above you, use a strong step stool that has a grab bar. Keep electrical cords out of the way. Do not use floor polish or wax that makes floors slippery. If you must use wax, use non-skid floor wax. Do not have throw rugs and other things on the floor that can make you trip. What can I do with my stairs? Do not leave any items on the stairs. Make sure that there are handrails on both sides of the stairs and use them. Fix handrails that are broken or loose. Make sure that handrails are as long as the stairways. Check any carpeting to make sure that it is firmly attached to the stairs. Fix any carpet that is loose or worn. Avoid having throw rugs at the top or bottom of the stairs. If you do have throw rugs, attach them to the floor with carpet tape. Make sure that you have a light switch at the  top of the stairs and the bottom of the stairs. If you do not have them, ask someone to add them for you. What else can I do to help prevent falls? Wear shoes that: Do not have high heels. Have rubber bottoms. Are comfortable and fit you well. Are closed at the toe. Do not wear sandals. If you use a stepladder: Make sure that it is fully opened. Do not climb a closed stepladder. Make sure that both sides of the stepladder are locked into place. Ask someone to hold it for you, if possible. Clearly mark and make sure that you can see: Any grab bars or handrails. First and last steps. Where the edge of each step is. Use tools that help you move around (mobility aids) if they are needed. These include: Canes. Walkers. Scooters. Crutches. Turn on the lights when you  go into a dark area. Replace any light bulbs as soon as they burn out. Set up your furniture so you have a clear path. Avoid moving your furniture around. If any of your floors are uneven, fix them. If there are any pets around you, be aware of where they are. Review your medicines with your doctor. Some medicines can make you feel dizzy. This can increase your chance of falling. Ask your doctor what other things that you can do to help prevent falls. This information is not intended to replace advice given to you by your health care provider. Make sure you discuss any questions you have with your health care provider. Document Released: 04/27/2009 Document Revised: 12/07/2015 Document Reviewed: 08/05/2014 Elsevier Interactive Patient Education  2017 [employer].

## 2023-01-09 ENCOUNTER — Other Ambulatory Visit: Payer: Self-pay | Admitting: Family Medicine

## 2023-01-17 ENCOUNTER — Other Ambulatory Visit: Payer: Self-pay | Admitting: Family Medicine

## 2023-02-18 ENCOUNTER — Ambulatory Visit (INDEPENDENT_AMBULATORY_CARE_PROVIDER_SITE_OTHER): Payer: Medicare Other | Admitting: Family Medicine

## 2023-02-18 ENCOUNTER — Encounter: Payer: Self-pay | Admitting: Family Medicine

## 2023-02-18 VITALS — BP 112/62 | HR 52 | Temp 97.8°F | Resp 19 | Ht 72.0 in | Wt 258.0 lb

## 2023-02-18 DIAGNOSIS — Z6834 Body mass index (BMI) 34.0-34.9, adult: Secondary | ICD-10-CM | POA: Diagnosis not present

## 2023-02-18 DIAGNOSIS — E785 Hyperlipidemia, unspecified: Secondary | ICD-10-CM | POA: Diagnosis not present

## 2023-02-18 DIAGNOSIS — E039 Hypothyroidism, unspecified: Secondary | ICD-10-CM | POA: Diagnosis not present

## 2023-02-18 DIAGNOSIS — E669 Obesity, unspecified: Secondary | ICD-10-CM

## 2023-02-18 LAB — CBC WITH DIFFERENTIAL/PLATELET
Basophils Absolute: 0.1 10*3/uL (ref 0.0–0.1)
Basophils Relative: 1.9 % (ref 0.0–3.0)
Eosinophils Absolute: 0.1 10*3/uL (ref 0.0–0.7)
Eosinophils Relative: 1.7 % (ref 0.0–5.0)
HCT: 37.8 % — ABNORMAL LOW (ref 39.0–52.0)
Hemoglobin: 11.7 g/dL — ABNORMAL LOW (ref 13.0–17.0)
Lymphocytes Relative: 38.9 % (ref 12.0–46.0)
Lymphs Abs: 1.5 10*3/uL (ref 0.7–4.0)
MCHC: 30.8 g/dL (ref 30.0–36.0)
MCV: 75.1 fl — ABNORMAL LOW (ref 78.0–100.0)
Monocytes Absolute: 0.3 10*3/uL (ref 0.1–1.0)
Monocytes Relative: 8.8 % (ref 3.0–12.0)
Neutro Abs: 1.8 10*3/uL (ref 1.4–7.7)
Neutrophils Relative %: 48.7 % (ref 43.0–77.0)
Platelets: 130 10*3/uL — ABNORMAL LOW (ref 150.0–400.0)
RBC: 5.04 Mil/uL (ref 4.22–5.81)
RDW: 17 % — ABNORMAL HIGH (ref 11.5–15.5)
WBC: 3.8 10*3/uL — ABNORMAL LOW (ref 4.0–10.5)

## 2023-02-18 LAB — LIPID PANEL
Cholesterol: 161 mg/dL (ref 0–200)
HDL: 31.4 mg/dL — ABNORMAL LOW (ref >39.00)
LDL Cholesterol: 105 mg/dL — ABNORMAL HIGH (ref 0–99)
NonHDL: 129.38
Total CHOL/HDL Ratio: 5
Triglycerides: 122 mg/dL (ref 0.0–149.0)
VLDL: 24.4 mg/dL (ref 0.0–40.0)

## 2023-02-18 LAB — BASIC METABOLIC PANEL
BUN: 11 mg/dL (ref 6–23)
CO2: 23 mEq/L (ref 19–32)
Calcium: 8.9 mg/dL (ref 8.4–10.5)
Chloride: 106 mEq/L (ref 96–112)
Creatinine, Ser: 1.1 mg/dL (ref 0.40–1.50)
GFR: 68.38 mL/min (ref >60.00)
Glucose, Bld: 99 mg/dL (ref 70–99)
Potassium: 4.4 mEq/L (ref 3.5–5.1)
Sodium: 137 mEq/L (ref 135–145)

## 2023-02-18 LAB — HEPATIC FUNCTION PANEL
ALT: 19 U/L (ref 0–53)
AST: 20 U/L (ref 0–37)
Albumin: 4.1 g/dL (ref 3.5–5.2)
Alkaline Phosphatase: 41 U/L (ref 39–117)
Bilirubin, Direct: 0.1 mg/dL (ref 0.0–0.3)
Total Bilirubin: 0.8 mg/dL (ref 0.2–1.2)
Total Protein: 6.8 g/dL (ref 6.0–8.3)

## 2023-02-18 LAB — TSH: TSH: 2.73 u[IU]/mL (ref 0.35–5.50)

## 2023-02-18 NOTE — Assessment & Plan Note (Signed)
Deteriorated.  Pt has gained 8 lbs since last visit.  Encouraged low carb diet and regular physical activity- particularly on the days he's not working in the yard.  Will continue to follow.

## 2023-02-18 NOTE — Patient Instructions (Signed)
Schedule your complete physical in 6 months We'll notify you of your lab results and make any changes if needed Continue to work on healthy diet and regular exercise- you can do it! Call with any questions or concerns Stay Safe!  Stay Healthy! Enjoy the rest of your summer!!!

## 2023-02-18 NOTE — Progress Notes (Signed)
   Subjective:    Patient ID: Jerome Scott, male    DOB: [birth year], 72 y.o.   MRN: [id number]  HPI Hyperlipidemia- chronic problem, on Lipitor 10mg  daily.  No CP, SOB, abd pain, N/V  Hypothyroid- chronic problem, on Levothyroxine daily.  No change in energy level.  No changes to skin/hair/nails.  Obesity- pt has gained 8 lbs since last visit.  BMI now 34.99  'too much ice cream'.  No regular exercise, does yard work.   Review of Systems For ROS see HPI     Objective:   Physical Exam Vitals reviewed.  Constitutional:      General: He is not in acute distress.    Appearance: Normal appearance. He is well-developed. He is obese. He is not ill-appearing.  HENT:     Head: Normocephalic and atraumatic.  Eyes:     Extraocular Movements: Extraocular movements intact.     Conjunctiva/sclera: Conjunctivae normal.     Pupils: Pupils are equal, round, and reactive to light.  Neck:     Thyroid: No thyromegaly.  Cardiovascular:     Rate and Rhythm: Normal rate and regular rhythm.     Pulses: Normal pulses.     Heart sounds: Normal heart sounds. No murmur heard. Pulmonary:     Effort: Pulmonary effort is normal. No respiratory distress.     Breath sounds: Normal breath sounds.  Abdominal:     General: Bowel sounds are normal. There is no distension.     Palpations: Abdomen is soft.  Musculoskeletal:     Cervical back: Normal range of motion and neck supple.     Right lower leg: No edema.     Left lower leg: No edema.  Lymphadenopathy:     Cervical: No cervical adenopathy.  Skin:    General: Skin is warm and dry.  Neurological:     General: No focal deficit present.     Mental Status: He is alert and oriented to person, place, and time.     Cranial Nerves: No cranial nerve deficit.  Psychiatric:        Mood and Affect: Mood normal.        Behavior: Behavior normal.           Assessment & Plan:

## 2023-02-18 NOTE — Assessment & Plan Note (Signed)
Chronic problem, on Levothyroxine daily.  Reports energy level is stable and no changes to skin/hair/nails.  Check labs.  Adjust meds prn

## 2023-02-18 NOTE — Assessment & Plan Note (Signed)
Chronic problem.  Currently on Lipitor daily w/o difficulty.  Encouraged regular physical activity.  Check labs.  Adjust meds prn

## 2023-02-19 ENCOUNTER — Telehealth: Payer: Self-pay

## 2023-02-19 NOTE — Telephone Encounter (Signed)
Left results on pt VM  

## 2023-02-19 NOTE — Telephone Encounter (Signed)
-----   Message from Neena Rhymes sent at 02/19/2023  7:31 AM EDT ----- Labs are stable and look good!  No changes at this time

## 2023-07-10 ENCOUNTER — Other Ambulatory Visit: Payer: Self-pay | Admitting: Family Medicine

## 2023-08-22 ENCOUNTER — Encounter: Payer: Self-pay | Admitting: Family Medicine

## 2023-08-22 ENCOUNTER — Ambulatory Visit (INDEPENDENT_AMBULATORY_CARE_PROVIDER_SITE_OTHER): Payer: Medicare Other | Admitting: Family Medicine

## 2023-08-22 VITALS — BP 120/70 | HR 58 | Temp 98.0°F | Ht 72.0 in | Wt 267.4 lb

## 2023-08-22 DIAGNOSIS — E669 Obesity, unspecified: Secondary | ICD-10-CM

## 2023-08-22 DIAGNOSIS — Z125 Encounter for screening for malignant neoplasm of prostate: Secondary | ICD-10-CM

## 2023-08-22 DIAGNOSIS — Z0000 Encounter for general adult medical examination without abnormal findings: Secondary | ICD-10-CM

## 2023-08-22 LAB — HEPATIC FUNCTION PANEL
ALT: 22 U/L (ref 0–53)
AST: 19 U/L (ref 0–37)
Albumin: 4.1 g/dL (ref 3.5–5.2)
Alkaline Phosphatase: 47 U/L (ref 39–117)
Bilirubin, Direct: 0.1 mg/dL (ref 0.0–0.3)
Total Bilirubin: 0.7 mg/dL (ref 0.2–1.2)
Total Protein: 6.6 g/dL (ref 6.0–8.3)

## 2023-08-22 LAB — TSH: TSH: 3.87 u[IU]/mL (ref 0.35–5.50)

## 2023-08-22 LAB — BASIC METABOLIC PANEL
BUN: 15 mg/dL (ref 6–23)
CO2: 27 meq/L (ref 19–32)
Calcium: 8.7 mg/dL (ref 8.4–10.5)
Chloride: 105 meq/L (ref 96–112)
Creatinine, Ser: 1.26 mg/dL (ref 0.40–1.50)
GFR: 57.89 mL/min — ABNORMAL LOW (ref >60.00)
Glucose, Bld: 95 mg/dL (ref 70–99)
Potassium: 4.6 meq/L (ref 3.5–5.1)
Sodium: 141 meq/L (ref 135–145)

## 2023-08-22 LAB — CBC WITH DIFFERENTIAL/PLATELET
Basophils Absolute: 0 10*3/uL (ref 0.0–0.1)
Basophils Relative: 1.2 % (ref 0.0–3.0)
Eosinophils Absolute: 0.1 10*3/uL (ref 0.0–0.7)
Eosinophils Relative: 1.6 % (ref 0.0–5.0)
HCT: 37.8 % — ABNORMAL LOW (ref 39.0–52.0)
Hemoglobin: 11.8 g/dL — ABNORMAL LOW (ref 13.0–17.0)
Lymphocytes Relative: 33.4 % (ref 12.0–46.0)
Lymphs Abs: 1.2 10*3/uL (ref 0.7–4.0)
MCHC: 31.1 g/dL (ref 30.0–36.0)
MCV: 74.4 fL — ABNORMAL LOW (ref 78.0–100.0)
Monocytes Absolute: 0.3 10*3/uL (ref 0.1–1.0)
Monocytes Relative: 9.2 % (ref 3.0–12.0)
Neutro Abs: 1.9 10*3/uL (ref 1.4–7.7)
Neutrophils Relative %: 54.6 % (ref 43.0–77.0)
Platelets: 132 10*3/uL — ABNORMAL LOW (ref 150.0–400.0)
RBC: 5.08 Mil/uL (ref 4.22–5.81)
RDW: 16.5 % — ABNORMAL HIGH (ref 11.5–15.5)
WBC: 3.5 10*3/uL — ABNORMAL LOW (ref 4.0–10.5)

## 2023-08-22 LAB — LIPID PANEL
Cholesterol: 159 mg/dL (ref 0–200)
HDL: 38 mg/dL — ABNORMAL LOW (ref >39.00)
LDL Cholesterol: 91 mg/dL (ref 0–99)
NonHDL: 120.78
Total CHOL/HDL Ratio: 4
Triglycerides: 150 mg/dL — ABNORMAL HIGH (ref 0.0–149.0)
VLDL: 30 mg/dL (ref 0.0–40.0)

## 2023-08-22 LAB — PSA, MEDICARE: PSA: 2.74 ng/mL (ref 0.10–4.00)

## 2023-08-22 NOTE — Assessment & Plan Note (Signed)
 Pt has gained 8 lbs since last visit.  Encouraged low carb diet and regular exercise.  Check labs to risk stratify.  Will follow.

## 2023-08-22 NOTE — Patient Instructions (Signed)
 Follow up in 6 months to recheck cholesterol We'll notify you of your lab results and make any changes if needed Continue to work on healthy diet and regular exercise- you can do it! Call with any questions or concerns Stay Safe!  Stay Healthy! Have a great weekend!!

## 2023-08-22 NOTE — Progress Notes (Signed)
   Subjective:    Patient ID: Jerome Scott, male    DOB: 1951-01-11, 73 y.o.   MRN: [id number]  HPI CPE- UTD on colonoscopy, PNA.  Pt reports feeling well.  Patient Care Team    Relationship Specialty Notifications Start End  Mahlon Comer BRAVO, MD PCP - General Family Medicine  07/21/17   Celestia Agent, MD (Inactive) Consulting Physician Gastroenterology  07/29/19     Health Maintenance  Topic Date Due   Zoster Vaccines- Shingrix (1 of 2) Never done   DTaP/Tdap/Td (2 - Td or Tdap) 01/31/2021   INFLUENZA VACCINE  Never done   COVID-19 Vaccine (5 - 2024-25 season) 03/16/2023   Medicare Annual Wellness (AWV)  11/14/2023   Colonoscopy  12/29/2023   Pneumonia Vaccine 70+ Years old  Completed   Hepatitis C Screening  Completed   HPV VACCINES  Aged Out      Review of Systems Patient reports no vision/hearing changes, anorexia, fever ,adenopathy, persistant/recurrent hoarseness, swallowing issues, chest pain, palpitations, edema, persistant/recurrent cough, hemoptysis, dyspnea (rest,exertional, paroxysmal nocturnal), gastrointestinal  bleeding (melena, rectal bleeding), abdominal pain, excessive heart burn, GU symptoms (dysuria, hematuria, voiding/incontinence issues) syncope, focal weakness, memory loss, numbness & tingling, skin/hair/nail changes, depression, anxiety, abnormal bruising/bleeding, musculoskeletal symptoms/signs.     Objective:   Physical Exam General Appearance:    Alert, cooperative, no distress, appears stated age. Obese  Head:    Normocephalic, without obvious abnormality, atraumatic  Eyes:    PERRL, conjunctiva/corneas clear, EOM's intact both eyes       Ears:    Normal TM's and external ear canals, both ears  Nose:   Nares normal, septum midline, mucosa normal, no drainage   or sinus tenderness  Throat:   Lips, mucosa, and tongue normal; teeth and gums normal  Neck:   Supple, symmetrical, trachea midline, no adenopathy;       thyroid :  No  enlargement/tenderness/nodules  Back:     Symmetric, no curvature, ROM normal, no CVA tenderness  Lungs:     Clear to auscultation bilaterally, respirations unlabored  Chest wall:    No tenderness or deformity  Heart:    Regular rate and rhythm, S1 and S2 normal, no murmur, rub   or gallop  Abdomen:     Soft, non-tender, bowel sounds active all four quadrants,    no masses, no organomegaly  Genitalia:    Deferred  Rectal:    Extremities:   Extremities normal, atraumatic, no cyanosis or edema  Pulses:   2+ and symmetric all extremities  Skin:   Skin color, texture, turgor normal, no rashes or lesions  Lymph nodes:   Cervical, supraclavicular, and axillary nodes normal  Neurologic:   CNII-XII intact. Normal strength, sensation and reflexes      throughout          Assessment & Plan:

## 2023-08-22 NOTE — Assessment & Plan Note (Signed)
 Pt's PE WNL w/ exception of obesity.  UTD on colonoscopy, PNA, flu.  Check labs.  Anticipatory guidance provided.

## 2023-08-24 ENCOUNTER — Encounter: Payer: Self-pay | Admitting: Family Medicine

## 2023-08-25 ENCOUNTER — Telehealth: Payer: Self-pay

## 2023-08-25 NOTE — Telephone Encounter (Signed)
-----   Message from Laymon Priest sent at 08/24/2023  4:57 PM EST ----- Labs are stable and look good.  No changes at this time

## 2023-08-25 NOTE — Telephone Encounter (Signed)
 Pt has reviewed labs via MyChart

## 2023-08-27 ENCOUNTER — Other Ambulatory Visit: Payer: Self-pay | Admitting: Family Medicine

## 2023-12-25 ENCOUNTER — Other Ambulatory Visit: Payer: Self-pay | Admitting: Family Medicine

## 2024-02-19 ENCOUNTER — Ambulatory Visit: Payer: Medicare Other | Admitting: Family Medicine

## 2024-02-19 ENCOUNTER — Encounter: Payer: Self-pay | Admitting: Family Medicine

## 2024-02-19 VITALS — BP 102/72 | HR 89 | Temp 98.0°F | Ht 72.0 in | Wt 262.1 lb

## 2024-02-19 DIAGNOSIS — E669 Obesity, unspecified: Secondary | ICD-10-CM | POA: Diagnosis not present

## 2024-02-19 DIAGNOSIS — E785 Hyperlipidemia, unspecified: Secondary | ICD-10-CM

## 2024-02-19 DIAGNOSIS — E039 Hypothyroidism, unspecified: Secondary | ICD-10-CM | POA: Diagnosis not present

## 2024-02-19 LAB — HEPATIC FUNCTION PANEL
ALT: 24 U/L (ref 0–53)
AST: 24 U/L (ref 0–37)
Albumin: 4.2 g/dL (ref 3.5–5.2)
Alkaline Phosphatase: 38 U/L — ABNORMAL LOW (ref 39–117)
Bilirubin, Direct: 0.2 mg/dL (ref 0.0–0.3)
Total Bilirubin: 1 mg/dL (ref 0.2–1.2)
Total Protein: 6.9 g/dL (ref 6.0–8.3)

## 2024-02-19 LAB — CBC WITH DIFFERENTIAL/PLATELET
Basophils Absolute: 0 K/uL (ref 0.0–0.1)
Basophils Relative: 1 % (ref 0.0–3.0)
Eosinophils Absolute: 0.1 K/uL (ref 0.0–0.7)
Eosinophils Relative: 1.6 % (ref 0.0–5.0)
HCT: 37.1 % — ABNORMAL LOW (ref 39.0–52.0)
Hemoglobin: 11.7 g/dL — ABNORMAL LOW (ref 13.0–17.0)
Lymphocytes Relative: 40.8 % (ref 12.0–46.0)
Lymphs Abs: 1.3 K/uL (ref 0.7–4.0)
MCHC: 31.7 g/dL (ref 30.0–36.0)
MCV: 72.9 fl — ABNORMAL LOW (ref 78.0–100.0)
Monocytes Absolute: 0.3 K/uL (ref 0.1–1.0)
Monocytes Relative: 8.9 % (ref 3.0–12.0)
Neutro Abs: 1.5 K/uL (ref 1.4–7.7)
Neutrophils Relative %: 47.7 % (ref 43.0–77.0)
Platelets: 126 K/uL — ABNORMAL LOW (ref 150.0–400.0)
RBC: 5.08 Mil/uL (ref 4.22–5.81)
RDW: 17.1 % — ABNORMAL HIGH (ref 11.5–15.5)
WBC: 3.2 K/uL — ABNORMAL LOW (ref 4.0–10.5)

## 2024-02-19 LAB — BASIC METABOLIC PANEL WITH GFR
BUN: 15 mg/dL (ref 6–23)
CO2: 25 meq/L (ref 19–32)
Calcium: 9.2 mg/dL (ref 8.4–10.5)
Chloride: 103 meq/L (ref 96–112)
Creatinine, Ser: 1.02 mg/dL (ref 0.40–1.50)
GFR: 74.34 mL/min (ref >60.00)
Glucose, Bld: 91 mg/dL (ref 70–99)
Potassium: 4.3 meq/L (ref 3.5–5.1)
Sodium: 137 meq/L (ref 135–145)

## 2024-02-19 LAB — LIPID PANEL
Cholesterol: 153 mg/dL (ref 0–200)
HDL: 29 mg/dL — ABNORMAL LOW (ref >39.00)
LDL Cholesterol: 99 mg/dL (ref 0–99)
NonHDL: 124.02
Total CHOL/HDL Ratio: 5
Triglycerides: 124 mg/dL (ref 0.0–149.0)
VLDL: 24.8 mg/dL (ref 0.0–40.0)

## 2024-02-19 LAB — TSH: TSH: 3.24 u[IU]/mL (ref 0.35–5.50)

## 2024-02-19 NOTE — Assessment & Plan Note (Signed)
 Improving.  Pt is down 5 lbs since last visit.  He reports he is trying to be more active.  Applauded his efforts.  Check labs to risk stratify.  Will follow.

## 2024-02-19 NOTE — Patient Instructions (Signed)
 Schedule your complete physical in 6 months We'll notify you of your lab results and make any changes if needed Continue to work on healthy diet and regular exercise- you can do it! Call [facility] and schedule your repeat colonoscopy Call with any questions or concerns Stay Safe!  Stay Healthy! ENJOY YOUR TRIP!!!

## 2024-02-19 NOTE — Assessment & Plan Note (Signed)
Chronic problem.  Currently asymptomatic on Levothyroxine 50mcg daily.  Check labs.  Adjust meds prn  

## 2024-02-19 NOTE — Progress Notes (Signed)
   Subjective:    Patient ID: Jerome Scott, male    DOB: 1950/09/12, 73 y.o.   MRN: [id number]  HPI Hyperlipidemia- chronic problem, on Lipitor 10mg  daily.  No CP, SOB, abd pain, N/V.  Hypothyroid- chronic problem, on Levothyroxine  50mcg daily.  No change in energy level.  No changes to skin/hair/nails  Obesity- pt is down 5 lbs since last visit.  BMI now 35.55  pt reports trying to be more active.   Review of Systems For ROS see HPI     Objective:   Physical Exam Vitals reviewed.  Constitutional:      General: He is not in acute distress.    Appearance: Normal appearance. He is well-developed. He is not ill-appearing.  HENT:     Head: Normocephalic and atraumatic.  Eyes:     Extraocular Movements: Extraocular movements intact.     Conjunctiva/sclera: Conjunctivae normal.     Pupils: Pupils are equal, round, and reactive to light.  Neck:     Thyroid : No thyromegaly.  Cardiovascular:     Rate and Rhythm: Normal rate and regular rhythm.     Pulses: Normal pulses.     Heart sounds: Normal heart sounds. No murmur heard. Pulmonary:     Effort: Pulmonary effort is normal. No respiratory distress.     Breath sounds: Normal breath sounds.  Abdominal:     General: Bowel sounds are normal. There is no distension.     Palpations: Abdomen is soft.  Musculoskeletal:     Cervical back: Normal range of motion and neck supple.     Right lower leg: No edema.     Left lower leg: No edema.  Lymphadenopathy:     Cervical: No cervical adenopathy.  Skin:    General: Skin is warm and dry.  Neurological:     General: No focal deficit present.     Mental Status: He is alert and oriented to person, place, and time.     Cranial Nerves: No cranial nerve deficit.  Psychiatric:        Mood and Affect: Mood normal.        Behavior: Behavior normal.           Assessment & Plan:

## 2024-02-19 NOTE — Assessment & Plan Note (Signed)
 Chronic problem, on Lipitor 10mg  daily w/o difficulty.  Check labs.  Adjust meds prn

## 2024-02-20 ENCOUNTER — Ambulatory Visit: Payer: Self-pay | Admitting: Family Medicine

## 2024-02-20 ENCOUNTER — Other Ambulatory Visit: Payer: Self-pay | Admitting: Family Medicine

## 2024-02-20 NOTE — Progress Notes (Signed)
 Pt has reviewed labs via MyChart

## 2024-03-29 ENCOUNTER — Other Ambulatory Visit: Payer: Self-pay

## 2024-03-29 ENCOUNTER — Encounter (HOSPITAL_BASED_OUTPATIENT_CLINIC_OR_DEPARTMENT_OTHER): Payer: Self-pay | Admitting: *Deleted

## 2024-03-29 ENCOUNTER — Emergency Department (HOSPITAL_BASED_OUTPATIENT_CLINIC_OR_DEPARTMENT_OTHER)
Admission: EM | Admit: 2024-03-29 | Discharge: 2024-03-29 | Disposition: A | Discharge status: Home / Self Care | Attending: Emergency Medicine | Admitting: Emergency Medicine

## 2024-03-29 DIAGNOSIS — R5383 Other fatigue: Secondary | ICD-10-CM | POA: Diagnosis not present

## 2024-03-29 DIAGNOSIS — R509 Fever, unspecified: Secondary | ICD-10-CM | POA: Diagnosis present

## 2024-03-29 DIAGNOSIS — B349 Viral infection, unspecified: Secondary | ICD-10-CM | POA: Insufficient documentation

## 2024-03-29 LAB — RESP PANEL BY RT-PCR (RSV, FLU A&B, COVID)  RVPGX2
Influenza A by PCR: NEGATIVE
Influenza B by PCR: NEGATIVE
Resp Syncytial Virus by PCR: NEGATIVE
SARS Coronavirus 2 by RT PCR: NEGATIVE

## 2024-03-29 MED ORDER — ACETAMINOPHEN 500 MG PO TABS
1000.0000 mg | ORAL_TABLET | Freq: Once | ORAL | Status: AC
  Administered 2024-03-29: 1000 mg via ORAL
  Filled 2024-03-29: qty 2

## 2024-03-29 NOTE — ED Provider Notes (Signed)
 [facility] Provider Note   CSN: [id number] Arrival date & time: 03/29/24  1924     Patient presents with: Generalized Body Aches   Jerome Scott is a 73 y.o. male who presents to the ED today primary concern of fever since returning home from [place] 3 days prior.  He also has generalized bodyaches but denies any nausea or vomiting and has tolerated oral intake without complication.  Last took a dose of acetaminophen  at approximately noon today, temperature when he woke up this morning was 103 F, at evaluation today his temperature was 100.8 F.  Denies any other symptoms at this time.   HPI     Prior to Admission medications   Medication Sig Start Date End Date Taking? Authorizing Provider  atorvastatin  (LIPITOR) 10 MG tablet TAKE 1 TABLET BY MOUTH EVERY DAY 02/20/24   Tabori, Katherine E, MD  dorzolamide-timolol (COSOPT) 22.3-6.8 MG/ML ophthalmic solution INSTILL 1 DROP INTO BOTH EYES TWICE A DAY 01/01/19   [provider]  latanoprost (XALATAN) 0.005 % ophthalmic solution INSTILL 1 DROP INTO BOTH EYES EVERY DAY IN THE EVENING 01/01/19   [provider]  levothyroxine  (SYNTHROID ) 50 MCG tablet TAKE 1 TABLET BY MOUTH EVERY DAY 08/27/23   Tabori, Katherine E, MD  omeprazole  (PRILOSEC) 20 MG capsule TAKE 1 CAPSULE BY MOUTH EVERY DAY 12/25/23   Tabori, Katherine E, MD    Allergies: Patient has no known allergies.    Review of Systems  Constitutional:  Positive for chills, fatigue and fever.  All other systems reviewed and are negative.   Updated Vital Signs BP (!) 149/82   Pulse 91   Temp (!) 100.8 F (38.2 C) (Oral)   Resp 20   SpO2 98%   Physical Exam Vitals and nursing note reviewed.  Constitutional:      General: He is not in acute distress.    Appearance: Normal appearance.  HENT:     Head: Normocephalic and atraumatic.     Mouth/Throat:     Mouth: Mucous membranes are moist.     Pharynx: Oropharynx is  clear.  Eyes:     Extraocular Movements: Extraocular movements intact.     Conjunctiva/sclera: Conjunctivae normal.     Pupils: Pupils are equal, round, and reactive to light.  Cardiovascular:     Rate and Rhythm: Normal rate and regular rhythm.     Pulses: Normal pulses.     Heart sounds: Normal heart sounds. No murmur heard.    No friction rub. No gallop.  Pulmonary:     Effort: Pulmonary effort is normal.     Breath sounds: Normal breath sounds.  Abdominal:     General: Abdomen is flat. Bowel sounds are normal.     Palpations: Abdomen is soft.  Musculoskeletal:        General: Normal range of motion.     Cervical back: Normal range of motion and neck supple.     Right lower leg: No edema.     Left lower leg: No edema.  Skin:    General: Skin is warm and dry.     Capillary Refill: Capillary refill takes less than 2 seconds.  Neurological:     General: No focal deficit present.     Mental Status: He is alert. Mental status is at baseline.  Psychiatric:        Mood and Affect: Mood normal.     (all labs ordered are listed, but only abnormal results  are displayed) Labs Reviewed  RESP PANEL BY RT-PCR (RSV, FLU A&B, COVID)  [id number]    EKG: None  Radiology: No results found.   Procedures   Medications Ordered in the ED  acetaminophen  (TYLENOL ) tablet 1,000 mg (1,000 mg Oral Given 03/29/24 2031)                                    Medical Decision Making Risk OTC drugs.   Based on the patient's symptoms, obtained a respiratory panel to evaluate for flu/COVID/RSV.  This was negative.  Physical exam is reassuring as he does not have any adventitious lung sounds, he is breathing normally, and fever is responding well to oral acetaminophen .  He tolerates oral intake without difficulty.  As such, believe this is a nonspecific viral URI, plan is to present is to discharge with outpatient therapy of acetaminophen  on schedule for 6 hours for the least the next 24 hours along  with increased oral fluids.  Follow-up to be made with primary care within the next week to 2 weeks as needed.  This is discussed with the patient, he understands and agrees has no further concerns at this time.     Final diagnoses:  Viral illness    ED Discharge Orders     None          Myriam Dorn BROCKS, [place] 03/29/24 2046    Tegeler, Lonni PARAS, MD 03/29/24 [id number]

## 2024-03-29 NOTE — ED Triage Notes (Signed)
 Pt has had a throbbing headache for 3 days and began having generalized bodyaches and fever today.  Highest temperature at home was 103F.  Pt mentions that he returned from a trip to [place] two weeks ago.  No URI symptoms. Temp 100.70F in triage.  Last dose of Tylenol  were 1g at noon.

## 2024-05-19 ENCOUNTER — Other Ambulatory Visit: Payer: Self-pay | Admitting: Family Medicine

## 2024-06-10 ENCOUNTER — Other Ambulatory Visit: Payer: Self-pay | Admitting: Family Medicine

## 2024-06-15 ENCOUNTER — Ambulatory Visit

## 2024-06-15 VITALS — Ht 72.0 in | Wt 262.0 lb

## 2024-06-15 DIAGNOSIS — Z1211 Encounter for screening for malignant neoplasm of colon: Secondary | ICD-10-CM

## 2024-06-15 DIAGNOSIS — Z0000 Encounter for general adult medical examination without abnormal findings: Secondary | ICD-10-CM | POA: Diagnosis not present

## 2024-06-15 DIAGNOSIS — K635 Polyp of colon: Secondary | ICD-10-CM

## 2024-06-15 NOTE — Patient Instructions (Addendum)
 Mr. Schwandt,  Thank you for taking the time for your Medicare Wellness Visit. I appreciate your continued commitment to your health goals. Please review the care plan we discussed, and feel free to reach out if I can assist you further.  Please note that Annual Wellness Visits do not include a physical exam. Some assessments may be limited, especially if the visit was conducted virtually. If needed, we may recommend an in-person follow-up with your provider.  Ongoing Care Seeing your primary care provider every 3 to 6 months helps us  monitor your health and provide consistent, personalized care.   Referrals If a referral was made during today's visit and you haven't received any updates within two weeks, please contact the referred provider directly to check on the status.  Recommended Screenings:  Health Maintenance  Topic Date Due   DTaP/Tdap/Td vaccine (2 - Td or Tdap) 01/31/2021   Colon Cancer Screening  12/29/2023   COVID-19 Vaccine (5 - 2025-26 season) 03/15/2024   Zoster (Shingles) Vaccine (1 of 2) 09/13/2024*   Flu Shot  10/12/2024*   Medicare Annual Wellness Visit  06/15/2025   Pneumococcal Vaccine for age over 53  Completed   Hepatitis C Screening  Completed   Meningitis B Vaccine  Aged Out  *Topic was postponed. The date shown is not the original due date.       06/15/2024    3:16 PM  Advanced Directives  Does Patient Have a Medical Advance Directive? Yes  Type of Estate Agent of [place];Living will  Does patient want to make changes to medical advance directive? Yes (Inpatient - patient requests chaplain consult to change a medical advance directive)  Copy of Healthcare Power of Attorney in Chart? Yes - validated most recent copy scanned in chart (See row information)    Vision: Annual vision screenings are recommended for early detection of glaucoma, cataracts, and diabetic retinopathy. These exams can also reveal signs of chronic conditions  such as diabetes and high blood pressure.  Dental: Annual dental screenings help detect early signs of oral cancer, gum disease, and other conditions linked to overall health, including heart disease and diabetes.

## 2024-06-15 NOTE — Progress Notes (Signed)
 Chief Complaint  Patient presents with   Medicare Wellness     Subjective:   Jerome Scott is a 73 y.o. male who presents for a Medicare Annual Wellness Visit.  I connected with  Tanda Sheller on 06/15/24 by a audio enabled telemedicine application and verified that I am speaking with the correct person using two identifiers.  Patient Location: Home  Provider Location: Office/Clinic  Persons Participating in Visit: Patient.  I discussed the limitations of evaluation and management by telemedicine. The patient expressed understanding and agreed to proceed.  Vital Signs: Because this visit was a virtual/telehealth visit, some criteria may be missing or patient reported. Any vitals not documented were not able to be obtained and vitals that have been documented are patient reported.   Visit info / Clinical Intake: Medicare Wellness Visit Type:: Subsequent Annual Wellness Visit Persons participating in visit and providing information:: patient Medicare Wellness Visit Mode:: Telephone If telephone:: video declined Since this visit was completed virtually, some vitals may be partially provided or unavailable. Missing vitals are due to the limitations of the virtual format.: Documented vitals are patient reported If Telephone or Video please confirm:: I connected with patient using audio/video enable telemedicine. I verified patient identity with two identifiers, discussed telehealth limitations, and patient agreed to proceed. Patient Location:: Home Provider Location:: Office Interpreter Needed?: No Pre-visit prep was completed: yes AWV questionnaire completed by patient prior to visit?: no Living arrangements:: lives with spouse/significant other Patient's Overall Health Status Rating: good Typical amount of pain: none Does pain affect daily life?: no Are you currently prescribed opioids?: no  Dietary Habits and Nutritional Risks How many meals a day?: 3 Eats fruit and  vegetables daily?: yes Most meals are obtained by: preparing own meals; eating out; having others provide food In the last 2 weeks, have you had any of the following?: none Diabetic:: no  Functional Status Activities of Daily Living (to include ambulation/medication): Independent Ambulation: Independent with device- listed below Home Assistive Devices/Equipment: Eyeglasses (wears eyeglasses for reading) Medication Administration: Independent Home Management (perform basic housework or laundry): Independent Manage your own finances?: yes Primary transportation is: driving Concerns about vision?: no *vision screening is required for WTM* Concerns about hearing?: no  Fall Screening Falls in the past year?: 0 Number of falls in past year: 0 Was there an injury with Fall?: 0 Fall Risk Category Calculator: 0 Patient Fall Risk Level: Low Fall Risk  Fall Risk Patient at Risk for Falls Due to: No Fall Risks Fall risk Follow up: Falls evaluation completed; Falls prevention discussed  Home and Transportation Safety: All rugs have non-skid backing?: N/A, no rugs All stairs or steps have railings?: yes (outside (garage area)) Grab bars in the bathtub or shower?: yes Have non-skid surface in bathtub or shower?: yes Good home lighting?: yes Regular seat belt use?: yes Hospital stays in the last year:: no  Cognitive Assessment Difficulty concentrating, remembering, or making decisions? : no Will 6CIT or Mini Cog be Completed: yes What year is it?: 0 points What month is it?: 0 points Give patient an address phrase to remember (5 components): [address] [place], [place] About what time is it?: 0 points Count backwards from 20 to 1: 0 points Say the months of the year in reverse: 0 points Repeat the address phrase from earlier: 0 points 6 CIT Score: 0 points  Advance Directives (For Healthcare) Does Patient Have a Medical Advance Directive?: Yes Does patient want to make changes to  medical advance  directive?: Yes (Inpatient - patient requests chaplain consult to change a medical advance directive) Type of Advance Directive: Healthcare Power of [place]; Living will Copy of Healthcare Power of Attorney in Chart?: Yes - validated most recent copy scanned in chart (See row information) Copy of Living Will in Chart?: Yes - validated most recent copy scanned in chart (See row information)  Reviewed/Updated  Reviewed/Updated: Reviewed All (Medical, Surgical, Family, Medications, Allergies, Care Teams, Patient Goals)    Allergies (verified) Patient has no known allergies.   Current Medications (verified) Outpatient Encounter Medications as of 06/15/2024  Medication Sig   atorvastatin  (LIPITOR) 10 MG tablet TAKE 1 TABLET BY MOUTH EVERY DAY   dorzolamide-timolol (COSOPT) 22.3-6.8 MG/ML ophthalmic solution INSTILL 1 DROP INTO BOTH EYES TWICE A DAY   latanoprost (XALATAN) 0.005 % ophthalmic solution INSTILL 1 DROP INTO BOTH EYES EVERY DAY IN THE EVENING   levothyroxine  (SYNTHROID ) 50 MCG tablet TAKE 1 TABLET BY MOUTH EVERY DAY   omeprazole  (PRILOSEC) 20 MG capsule TAKE 1 CAPSULE BY MOUTH EVERY DAY   No facility-administered encounter medications on file as of 06/15/2024.    History: Past Medical History:  Diagnosis Date   Blood in stool    GERD (gastroesophageal reflux disease)    Past Surgical History:  Procedure Laterality Date   umbical hernia  05/2022   Family History  Problem Relation Age of Onset   Hyperlipidemia Mother    Hypertension Mother    Heart disease Father    Cancer Father        brain tumor   Diabetes Father    Heart attack Father    Diabetes Sister    Heart attack Sister    Hyperlipidemia Sister    Hypertension Sister    Diabetes Brother    Alcohol abuse Daughter    Heart attack Maternal Grandmother    Heart attack Maternal Grandfather    Alcohol abuse Paternal Grandfather    Heart attack Brother    Hyperlipidemia Brother     Hypertension Brother    Heart attack Brother    Hyperlipidemia Brother    Hypertension Brother    Social History   Occupational History   Not on file  Tobacco Use   Smoking status: Never    Passive exposure: Past   Smokeless tobacco: Never  Vaping Use   Vaping status: Never Used  Substance and Sexual Activity   Alcohol use: No   Drug use: No   Sexual activity: Yes   Tobacco Counseling Counseling given: Not Answered  SDOH Screenings   Food Insecurity: No Food Insecurity (06/15/2024)  Housing: Unknown (06/15/2024)  Transportation Needs: No Transportation Needs (06/15/2024)  Utilities: Not At Risk (06/15/2024)  Alcohol Screen: Low Risk  (11/14/2022)  Depression (PHQ2-9): Low Risk  (06/15/2024)  Financial Resource Strain: Low Risk  (02/12/2024)  Physical Activity: Insufficiently Active (06/15/2024)  Social Connections: Moderately Integrated (06/15/2024)  Stress: No Stress Concern Present (06/15/2024)  Tobacco Use: Low Risk  (06/15/2024)  Health Literacy: Adequate Health Literacy (06/15/2024)   See flowsheets for full screening details  Depression Screen PHQ 2 & 9 Depression Scale- Over the past 2 weeks, how often have you been bothered by any of the following problems? Little interest or pleasure in doing things: 0 Feeling down, depressed, or hopeless (PHQ Adolescent also includes...irritable): 0 PHQ-2 Total Score: 0 Trouble falling or staying asleep, or sleeping too much: 0 Feeling tired or having little energy: 0 Poor appetite or overeating (PHQ Adolescent also includes...weight loss): 0 Feeling  bad about yourself - or that you are a failure or have let yourself or your family down: 0 Trouble concentrating on things, such as reading the newspaper or watching television (PHQ Adolescent also includes...like school work): 0 Moving or speaking so slowly that other people could have noticed. Or the opposite - being so fidgety or restless that you have been moving around a lot more than  usual: 0 Thoughts that you would be better off dead, or of hurting yourself in some way: 0 PHQ-9 Total Score: 0 If you checked off any problems, how difficult have these problems made it for you to do your work, take care of things at home, or get along with other people?: Not difficult at all  Depression Treatment Depression Interventions/Treatment : EYV7-0 Score <4 Follow-up Not Indicated     Goals Addressed               This Visit's Progress     Patient Stated (pt-stated)        Patient stated he plans to continue staying active and keep walking             Objective:    Today's Vitals   06/15/24 1514  Weight: 262 lb (118.8 kg)  Height: 6' (1.829 m)   Body mass index is 35.53 kg/m.  Hearing/Vision screen Hearing Screening - Comments:: Denies hearing difficulties   Vision Screening - Comments:: Wears eyeglasses for reading - up to date with routine eye exams with Dr Regenia Immunizations and Health Maintenance Health Maintenance  Topic Date Due   DTaP/Tdap/Td (2 - Td or Tdap) 01/31/2021   Colonoscopy  12/29/2023   COVID-19 Vaccine (5 - 2025-26 season) 03/15/2024   Zoster Vaccines- Shingrix (1 of 2) 09/13/2024 (Originally 05/24/2003)   Influenza Vaccine  10/12/2024 (Originally 02/13/2024)   Medicare Annual Wellness (AWV)  06/15/2025   Pneumococcal Vaccine: 50+ Years  Completed   Hepatitis C Screening  Completed   Meningococcal B Vaccine  Aged Out        Assessment/Plan:  This is a routine wellness examination for Alon.  Colonoscopy status: Referral to [facility] - Dr Glendia Holt (h/o colon polyps)  Patient Care Team: Mahlon Comer BRAVO, MD as PCP - General (Family Medicine) Celestia Agent, MD (Inactive) as Consulting Physician (Gastroenterology) Regenia, Prentice Clack, MD as Consulting Physician (Ophthalmology)  I have personally reviewed and noted the following in the patient's chart:   Medical and social history Use of alcohol, tobacco or  illicit drugs  Current medications and supplements including opioid prescriptions. Functional ability and status Nutritional status Physical activity Advanced directives List of other physicians Hospitalizations, surgeries, and ER visits in previous 12 months Vitals Screenings to include cognitive, depression, and falls Referrals and appointments  Orders Placed This Encounter  Procedures   Ambulatory referral to Gastroenterology    Referral Priority:   Routine    Referral Type:   Consultation    Referral Reason:   Specialty Services Required    Referred to Provider:   Holt Glendia BRAVO, MD    Number of Visits Requested:   1   In addition, I have reviewed and discussed with patient certain preventive protocols, quality metrics, and best practice recommendations. A written personalized care plan for preventive services as well as general preventive health recommendations were provided to patient.   Verdie CHRISTELLA Saba, CMA   06/15/2024   Return in 1 year (on 06/15/2025).  After Visit Summary: (MyChart) Due to this being a telephonic visit,  the after visit summary with patients personalized plan was offered to patient via MyChart   Nurse Notes: Scheduled a 2026 AWV appt.

## 2024-08-19 ENCOUNTER — Other Ambulatory Visit: Payer: Self-pay | Admitting: Family Medicine

## 2024-08-23 ENCOUNTER — Encounter: Admitting: Family Medicine

## 2025-06-22 ENCOUNTER — Ambulatory Visit
